# Patient Record
Sex: Female | Born: 1990 | Race: White | Hispanic: No | Marital: Single | Smoking: Current some day smoker
Health system: Southern US, Community
[De-identification: ages and names within clinical notes are randomized; demographics above are authoritative.]

## PROBLEM LIST (undated history)

## (undated) DIAGNOSIS — A6004 Herpesviral vulvovaginitis: Secondary | ICD-10-CM

## (undated) DIAGNOSIS — F411 Generalized anxiety disorder: Secondary | ICD-10-CM

## (undated) DIAGNOSIS — F988 Other specified behavioral and emotional disorders with onset usually occurring in childhood and adolescence: Secondary | ICD-10-CM

## (undated) DIAGNOSIS — G44229 Chronic tension-type headache, not intractable: Secondary | ICD-10-CM

## (undated) DIAGNOSIS — B002 Herpesviral gingivostomatitis and pharyngotonsillitis: Secondary | ICD-10-CM

## (undated) HISTORY — DX: Other specified behavioral and emotional disorders with onset usually occurring in childhood and adolescence: F98.8

## (undated) HISTORY — DX: Chronic tension-type headache, not intractable: G44.229

## (undated) HISTORY — PX: TONSILLECTOMY: SHX5217

## (undated) HISTORY — DX: Herpesviral vulvovaginitis: A60.04

## (undated) HISTORY — DX: Herpesviral gingivostomatitis and pharyngotonsillitis: B00.2

## (undated) HISTORY — DX: Generalized anxiety disorder: F41.1

---
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
## 2012-04-20 ENCOUNTER — Observation Stay: Payer: Self-pay | Admitting: Internal Medicine

## 2012-04-20 LAB — PRENATAL PANEL
ABO/RH(D): O POS
Antibody Screen: NEGATIVE
Glucose: 75 mg/dL (ref 65–99)
HCT: 31.4 % — ABNORMAL LOW (ref 35.0–47.0)
HGB: 10.8 g/dL — ABNORMAL LOW (ref 12.0–16.0)
MCH: 31.1 pg (ref 26.0–34.0)
MCHC: 34.4 g/dL (ref 32.0–36.0)
Platelet: 244 10*3/uL (ref 150–440)
WBC: 13.1 10*3/uL — ABNORMAL HIGH (ref 3.6–11.0)

## 2012-09-05 ENCOUNTER — Inpatient Hospital Stay: Payer: Self-pay | Admitting: Obstetrics & Gynecology

## 2012-09-05 LAB — CBC WITH DIFFERENTIAL/PLATELET
Basophil #: 0.1 10*3/uL (ref 0.0–0.1)
Eosinophil %: 0.4 %
Lymphocyte #: 1.6 10*3/uL (ref 1.0–3.6)
Lymphocyte %: 17.5 %
MCH: 31.7 pg (ref 26.0–34.0)
Monocyte %: 9.9 %
Neutrophil #: 6.6 10*3/uL — ABNORMAL HIGH (ref 1.4–6.5)
Neutrophil %: 71.1 %
RBC: 3.8 10*6/uL (ref 3.80–5.20)
RDW: 15.1 % — ABNORMAL HIGH (ref 11.5–14.5)

## 2012-09-06 LAB — GC/CHLAMYDIA PROBE AMP

## 2012-09-08 LAB — PATHOLOGY REPORT

## 2012-09-08 LAB — HEMATOCRIT: HCT: 27.1 % — ABNORMAL LOW (ref 35.0–47.0)

## 2013-07-01 ENCOUNTER — Emergency Department: Payer: Self-pay | Admitting: Emergency Medicine

## 2013-07-02 LAB — URINALYSIS, COMPLETE
BILIRUBIN, UR: NEGATIVE
Bacteria: NONE SEEN
Blood: NEGATIVE
GLUCOSE, UR: NEGATIVE mg/dL (ref 0–75)
Ketone: NEGATIVE
Leukocyte Esterase: NEGATIVE
NITRITE: NEGATIVE
PH: 5 (ref 4.5–8.0)
PROTEIN: NEGATIVE
Specific Gravity: 1.031 (ref 1.003–1.030)
Squamous Epithelial: 1
WBC UR: 4 /HPF (ref 0–5)

## 2013-07-02 LAB — COMPREHENSIVE METABOLIC PANEL
ALBUMIN: 3.9 g/dL (ref 3.4–5.0)
ALK PHOS: 79 U/L
ALT: 15 U/L (ref 12–78)
ANION GAP: 6 — AB (ref 7–16)
BUN: 14 mg/dL (ref 7–18)
Bilirubin,Total: 0.4 mg/dL (ref 0.2–1.0)
Calcium, Total: 8.8 mg/dL (ref 8.5–10.1)
Chloride: 105 mmol/L (ref 98–107)
Co2: 26 mmol/L (ref 21–32)
Creatinine: 0.88 mg/dL (ref 0.60–1.30)
EGFR (Non-African Amer.): >60
GLUCOSE: 83 mg/dL (ref 65–99)
Osmolality: 273 (ref 275–301)
POTASSIUM: 3.9 mmol/L (ref 3.5–5.1)
SGOT(AST): 14 U/L — ABNORMAL LOW (ref 15–37)
Sodium: 137 mmol/L (ref 136–145)
Total Protein: 7 g/dL (ref 6.4–8.2)

## 2013-07-02 LAB — CBC WITH DIFFERENTIAL/PLATELET
Basophil #: 0 10*3/uL (ref 0.0–0.1)
Basophil %: 0.4 %
Eosinophil #: 0 10*3/uL (ref 0.0–0.7)
Eosinophil %: 0.7 %
HCT: 38.6 % (ref 35.0–47.0)
HGB: 12.8 g/dL (ref 12.0–16.0)
LYMPHS ABS: 1.2 10*3/uL (ref 1.0–3.6)
LYMPHS PCT: 19.2 %
MCH: 28.8 pg (ref 26.0–34.0)
MCHC: 33.1 g/dL (ref 32.0–36.0)
MCV: 87 fL (ref 80–100)
MONO ABS: 0.6 x10 3/mm (ref 0.2–0.9)
MONOS PCT: 9.4 %
Neutrophil #: 4.2 10*3/uL (ref 1.4–6.5)
Neutrophil %: 70.3 %
Platelet: 247 10*3/uL (ref 150–440)
RBC: 4.45 10*6/uL (ref 3.80–5.20)
RDW: 13.3 % (ref 11.5–14.5)
WBC: 6 10*3/uL (ref 3.6–11.0)

## 2013-07-02 LAB — PREGNANCY, URINE: Pregnancy Test, Urine: NEGATIVE m[IU]/mL

## 2014-09-29 ENCOUNTER — Inpatient Hospital Stay
Admission: AD | Admit: 2014-09-29 | Discharge: 2014-10-02 | DRG: 885 | Disposition: A | Discharge status: Home / Self Care | Payer: 59 | Source: Ambulatory Visit | Attending: Psychiatry | Admitting: Psychiatry

## 2014-09-29 ENCOUNTER — Emergency Department
Admission: EM | Admit: 2014-09-29 | Discharge: 2014-09-29 | Disposition: A | Discharge status: Other Acute Inpatient Hospital | Payer: 59 | Attending: Emergency Medicine | Admitting: Emergency Medicine

## 2014-09-29 ENCOUNTER — Encounter: Payer: Self-pay | Admitting: *Deleted

## 2014-09-29 ENCOUNTER — Encounter: Payer: Self-pay | Admitting: Emergency Medicine

## 2014-09-29 DIAGNOSIS — F1310 Sedative, hypnotic or anxiolytic abuse, uncomplicated: Secondary | ICD-10-CM | POA: Diagnosis present

## 2014-09-29 DIAGNOSIS — R45851 Suicidal ideations: Secondary | ICD-10-CM | POA: Diagnosis present

## 2014-09-29 DIAGNOSIS — T1491 Suicide attempt: Secondary | ICD-10-CM | POA: Insufficient documentation

## 2014-09-29 DIAGNOSIS — F1220 Cannabis dependence, uncomplicated: Secondary | ICD-10-CM | POA: Diagnosis present

## 2014-09-29 DIAGNOSIS — F909 Attention-deficit hyperactivity disorder, unspecified type: Secondary | ICD-10-CM | POA: Diagnosis present

## 2014-09-29 DIAGNOSIS — F1193 Opioid use, unspecified with withdrawal: Secondary | ICD-10-CM

## 2014-09-29 DIAGNOSIS — Z811 Family history of alcohol abuse and dependence: Secondary | ICD-10-CM

## 2014-09-29 DIAGNOSIS — F17210 Nicotine dependence, cigarettes, uncomplicated: Secondary | ICD-10-CM | POA: Diagnosis present

## 2014-09-29 DIAGNOSIS — F322 Major depressive disorder, single episode, severe without psychotic features: Secondary | ICD-10-CM | POA: Diagnosis present

## 2014-09-29 DIAGNOSIS — F1123 Opioid dependence with withdrawal: Secondary | ICD-10-CM | POA: Diagnosis present

## 2014-09-29 DIAGNOSIS — F17200 Nicotine dependence, unspecified, uncomplicated: Secondary | ICD-10-CM

## 2014-09-29 DIAGNOSIS — F419 Anxiety disorder, unspecified: Secondary | ICD-10-CM | POA: Diagnosis present

## 2014-09-29 DIAGNOSIS — F1210 Cannabis abuse, uncomplicated: Secondary | ICD-10-CM | POA: Insufficient documentation

## 2014-09-29 DIAGNOSIS — Z720 Tobacco use: Secondary | ICD-10-CM | POA: Insufficient documentation

## 2014-09-29 DIAGNOSIS — F1120 Opioid dependence, uncomplicated: Secondary | ICD-10-CM

## 2014-09-29 DIAGNOSIS — G4700 Insomnia, unspecified: Secondary | ICD-10-CM | POA: Diagnosis present

## 2014-09-29 DIAGNOSIS — F1320 Sedative, hypnotic or anxiolytic dependence, uncomplicated: Secondary | ICD-10-CM

## 2014-09-29 LAB — COMPREHENSIVE METABOLIC PANEL
ALK PHOS: 47 U/L (ref 38–126)
ALT: 14 U/L (ref 14–54)
AST: 31 U/L (ref 15–41)
Albumin: 5.2 g/dL — ABNORMAL HIGH (ref 3.5–5.0)
Anion gap: 9 (ref 5–15)
BUN: 16 mg/dL (ref 6–20)
CALCIUM: 9.6 mg/dL (ref 8.9–10.3)
CO2: 25 mmol/L (ref 22–32)
CREATININE: 0.96 mg/dL (ref 0.44–1.00)
Chloride: 105 mmol/L (ref 101–111)
Glucose, Bld: 92 mg/dL (ref 65–99)
Potassium: 4.3 mmol/L (ref 3.5–5.1)
Sodium: 139 mmol/L (ref 135–145)
Total Bilirubin: 1.6 mg/dL — ABNORMAL HIGH (ref 0.3–1.2)
Total Protein: 8.4 g/dL — ABNORMAL HIGH (ref 6.5–8.1)

## 2014-09-29 LAB — CBC
HEMATOCRIT: 38.2 % (ref 35.0–47.0)
Hemoglobin: 13 g/dL (ref 12.0–16.0)
MCH: 29.6 pg (ref 26.0–34.0)
MCHC: 34.1 g/dL (ref 32.0–36.0)
MCV: 86.7 fL (ref 80.0–100.0)
PLATELETS: 232 10*3/uL (ref 150–440)
RBC: 4.4 MIL/uL (ref 3.80–5.20)
RDW: 13.1 % (ref 11.5–14.5)
WBC: 11.3 10*3/uL — AB (ref 3.6–11.0)

## 2014-09-29 LAB — ETHANOL

## 2014-09-29 LAB — URINE DRUG SCREEN, QUALITATIVE (ARMC ONLY)
AMPHETAMINES, UR SCREEN: NOT DETECTED
BARBITURATES, UR SCREEN: POSITIVE — AB
BENZODIAZEPINE, UR SCRN: POSITIVE — AB
CANNABINOID 50 NG, UR ~~LOC~~: POSITIVE — AB
Cocaine Metabolite,Ur ~~LOC~~: NOT DETECTED
MDMA (Ecstasy)Ur Screen: NOT DETECTED
Methadone Scn, Ur: NOT DETECTED
OPIATE, UR SCREEN: NOT DETECTED
PHENCYCLIDINE (PCP) UR S: NOT DETECTED
Tricyclic, Ur Screen: NOT DETECTED

## 2014-09-29 LAB — PREGNANCY, URINE: PREG TEST UR: NEGATIVE

## 2014-09-29 MED ORDER — ACETAMINOPHEN 325 MG PO TABS: 650.0000 mg | ORAL_TABLET | Freq: Four times a day (QID) | ORAL | Status: DC | PRN

## 2014-09-29 MED ORDER — FLUOXETINE HCL 20 MG PO CAPS
20.0000 mg | ORAL_CAPSULE | Freq: Every day | ORAL | Status: DC
  Administered 2014-09-30: 20 mg via ORAL
  Filled 2014-09-29: qty 1

## 2014-09-29 MED ORDER — INFLUENZA VAC SPLIT QUAD 0.5 ML IM SUSY: 0.5000 mL | PREFILLED_SYRINGE | INTRAMUSCULAR | Status: DC

## 2014-09-29 MED ORDER — ALUM & MAG HYDROXIDE-SIMETH 200-200-20 MG/5ML PO SUSP: 30.0000 mL | ORAL | Status: DC | PRN

## 2014-09-29 MED ORDER — TRAZODONE HCL 50 MG PO TABS
50.0000 mg | ORAL_TABLET | Freq: Every evening | ORAL | Status: DC | PRN
  Administered 2014-09-29: 50 mg via ORAL
  Filled 2014-09-29: qty 1

## 2014-09-29 MED ORDER — LORAZEPAM 2 MG PO TABS
2.0000 mg | ORAL_TABLET | Freq: Once | ORAL | Status: AC
  Administered 2014-09-29: 2 mg via ORAL
  Filled 2014-09-29: qty 1

## 2014-09-29 MED ORDER — MAGNESIUM HYDROXIDE 400 MG/5ML PO SUSP: 30.0000 mL | Freq: Every day | ORAL | Status: DC | PRN

## 2014-09-29 NOTE — ED Notes (Signed)
MD at bedside. Dr.Quigley, IVC paper being started , no 1:1 sitter needed, q 15 min safety check

## 2014-09-29 NOTE — ED Notes (Signed)
BEHAVIORAL HEALTH ROUNDING Patient sleeping: No. Patient alert and oriented: yes Behavior appropriate: Yes.  ; If no, describe:  Nutrition and fluids offered: Yes  Toileting and hygiene offered: Yes  Sitter present: yes Law enforcement present: Yes  

## 2014-09-29 NOTE — Consult Note (Addendum)
[facility] Consult   Reason for Consult:  "I want to end it all."  Referring Physician:  Daymon Larsen, MD  Patient Identification: Laura Mcintosh MRN:  [id number]   Principal Diagnosis: Suicide attempt Diagnosis:   Patient Active Problem List   Diagnosis Date Noted  . Adjustment disorder with mixed anxiety and depressed mood [F43.23] 09/29/2014  . Suicide attempt [T14.91] 09/29/2014    Total Time spent with patient: 30 minutes  Subjective:   Laura Mcintosh is a 24 y.o. female patient admitted with tearfulness and stating she "wants to end it all."  Nursing notes were reviewed by this provider. It appears the pt broke up with her boyfriend 2 days ago. Yesterday, she attempted to overdose and cut her wrist with a dull razor.  Today the pt reports she want to jump off of a bridge. IVC was started.   Per ED Physician's notes, the pt presents with SI. She shared she had poor interactions with the father of her child recently.  She endorsed overdosing on medications yesterday with intent to harm herself and she tried to cut her wrists. She endorsed taking "uppers and downers" to the ED physician which included clonidine, cocaine and Suboxone.  While on her way to the hospital, the pt reported she considered driving off a bridge to harm herself. She denied HI and AVH.  She denies a previous h/o SI.   Today on interview Laura Mcintosh states, "I lost the 3 most important people to me." She is tearful, making poor eye contact and shared she lost her boyfriend of 5 years (he is the father of her 2yo son), her mother "who has always been there for me" and her 2yo son. About 2 days ago, she broke with her boyfriend. She discussed her boyfriend and her mother now want nothing to do with her but this provider was unable to understand why they do not want anything to do with the patient.  Due to these losses, the pt attempted suicide yesterday as noted above. The pt is not remorseful  of the event and wishes she was successful in completing suicide. She shared she wanted to cut her wrists but she as too afraid to do so.  She is not happy to be alive at the time of this interview. "I feel like a horrible mother."   For the past several days, the pt endorses the following: decreased sleep; insomnia; guilt; anhedonia; decreased energy; decreased concentration; decreased appetite and suicidal ideation.   When driving to the ER, she thought about driving off of the [place] bridge but did not do so because she thought about her son. She notes he is the only reason I am alive from day to day.   She discussed the relationship with her ex-boyfriend is unstable with frequent arguing.  She shared the relationship is not a good or healthy relationship for her; the pt did not discuss this in detail.   Pt currently endorses SI without a specific plan. She denies HI and AVH.   HPI:  See above HPI Elements:   Quality:  worsening. Severity:  severe. Timing:  the past 2 days. Duration:  several days. Context:  due to break up with boyfriend.   Past Psychiatric History: Past diagnoses: Pt is uncertain but she believes ADHD, anxiety and insomnia Psychiatrist: Does not have a current psychiatrist.   - last saw a psychiatrist, possibly Dr. Wynetta Emery in [place], [place] about 2-3 years ago.  Therapist: Does not currently  have a therapist  - Pt did have a therapist in [place], [place] Hospitalizations: Denies ECT: Denies Suicide attempt/Self-harm: Denies past attempts to this provider other than as mentioned in the HPI.  Homicide attempts/harming others: Denies Past psychiatric medications: Adderall, clonazepam, Xanax and Trazodone.  - Pt felt medications were helpful.  - Pt has not tried Vistaril.   Past Medical History: History reviewed. No pertinent past medical history. History reviewed. No pertinent past surgical history.   Family History: History reviewed. No pertinent family history.    Social History:  History  Alcohol Use No     History  Drug Use Not on file    Social History   Social History  . Marital Status: Single    Spouse Name: N/A  . Number of Children: N/A  . Years of Education: N/A   Social History Main Topics  . Smoking status: Current Every Day Smoker  . Smokeless tobacco: None  . Alcohol Use: No  . Drug Use: None  . Sexual Activity: Not Asked   Other Topics Concern  . None   Social History Narrative  . None   Additional Social History: Was living with boyfriend.  Has a 2yo son.   Allergies:  No Known Allergies  Labs:  Results for orders placed or performed during the hospital encounter of 09/29/14 (from the past 48 hour(s))  Comprehensive metabolic panel     Status: Abnormal   Collection Time: 09/29/14  9:24 AM  Result Value Ref Range   Sodium 139 135 - 145 mmol/L   Potassium 4.3 3.5 - 5.1 mmol/L    Comment: HEMOLYSIS AT THIS LEVEL MAY AFFECT RESULT   Chloride 105 101 - 111 mmol/L   CO2 25 22 - 32 mmol/L   Glucose, Bld 92 65 - 99 mg/dL   BUN 16 6 - 20 mg/dL   Creatinine, Ser 0.96 0.44 - 1.00 mg/dL   Calcium 9.6 8.9 - 10.3 mg/dL   Total Protein 8.4 (H) 6.5 - 8.1 g/dL   Albumin 5.2 (H) 3.5 - 5.0 g/dL   AST 31 15 - 41 U/L    Comment: HEMOLYSIS AT THIS LEVEL MAY AFFECT RESULT   ALT 14 14 - 54 U/L   Alkaline Phosphatase 47 38 - 126 U/L   Total Bilirubin 1.6 (H) 0.3 - 1.2 mg/dL    Comment: HEMOLYSIS AT THIS LEVEL MAY AFFECT RESULT   GFR calc non Af Amer >60 >60 mL/min   GFR calc Af Amer >60 >60 mL/min    Comment: (NOTE) The eGFR has been calculated using the CKD EPI equation. This calculation has not been validated in all clinical situations. eGFR's persistently <60 mL/min signify possible Chronic Kidney Disease.    Anion gap 9 5 - 15  Ethanol (ETOH)     Status: None   Collection Time: 09/29/14  9:24 AM  Result Value Ref Range   Alcohol, Ethyl (B) <5 <5 mg/dL    Comment:        LOWEST DETECTABLE LIMIT FOR SERUM  ALCOHOL IS 5 mg/dL FOR MEDICAL PURPOSES ONLY   CBC     Status: Abnormal   Collection Time: 09/29/14  9:24 AM  Result Value Ref Range   WBC 11.3 (H) 3.6 - 11.0 K/uL   RBC 4.40 3.80 - 5.20 MIL/uL   Hemoglobin 13.0 12.0 - 16.0 g/dL   HCT 38.2 35.0 - 47.0 %   MCV 86.7 80.0 - 100.0 fL   MCH 29.6 26.0 - 34.0 pg  MCHC 34.1 32.0 - 36.0 g/dL   RDW 13.1 11.5 - 14.5 %   Platelets 232 150 - 440 K/uL    Vitals: Blood pressure 121/75, pulse 90, temperature 98.9 F (37.2 C), temperature source Oral, resp. rate 20, height 5' 1"  (1.549 m), weight 47.628 kg (105 lb), last menstrual period 09/15/2014, SpO2 100 %.  Risk to Self: Is patient at risk for suicide?: No Risk to Others:   Prior Inpatient Therapy:   Prior Outpatient Therapy:    Current Facility-Administered Medications  Medication Dose Route Frequency Provider Last Rate Last Dose  . LORazepam (ATIVAN) tablet 2 mg  2 mg Oral Once Daymon Larsen, MD       No current outpatient prescriptions on file.    Musculoskeletal: Strength & Muscle Tone: within normal limits Gait & Station: not assessed Patient leans: N/A  Psychiatric Specialty Exam: Physical Exam  Review of Systems  Constitutional: Negative.   HENT: Negative.   Eyes: Negative.   Respiratory: Negative.   Cardiovascular: Negative.   Gastrointestinal: Negative.   Genitourinary: Negative.   Musculoskeletal: Negative.   Skin: Negative.   Neurological: Negative.   Endo/Heme/Allergies: Negative.   Psychiatric/Behavioral: Positive for depression, suicidal ideas and substance abuse. Negative for hallucinations and memory loss. The patient is nervous/anxious and has insomnia.     Blood pressure 121/75, pulse 90, temperature 98.9 F (37.2 C), temperature source Oral, resp. rate 20, height 5' 1"  (1.549 m), weight 47.628 kg (105 lb), last menstrual period 09/15/2014, SpO2 100 %.Body mass index is 19.85 kg/(m^2).  General Appearance: Casual, Fairly Groomed and wearing disposible  hospital scrubs  Eye Contact::  Poor  Speech:  Garbled and Normal Rate  Volume:  Normal  Mood:  Depressed  Affect:  Depressed and Tearful  Thought Process:  Coherent, Goal Directed, Intact, Linear and Logical  Orientation:  Full (Time, Place, and Person)  Thought Content:  Negative  Suicidal Thoughts:  Yes.  without intent/plan  Homicidal Thoughts:  No  Memory:  Immediate;   Good Recent;   Good Remote;   Good  Judgement:  Poor  Insight:  Fair  Psychomotor Activity:  Normal  Concentration:  Fair  Recall:  Good  Fund of Knowledge:Good  Language: Good  Akathisia:  Negative  Handed:  Right  AIMS (if indicated):     Assets:  Communication Skills Desire for Improvement Physical Health Resilience Social Support Transportation  ADL's:  Intact  Cognition: WNL  Sleep:   decreased   Medical Decision Making: Review of Psycho-Social Stressors (1), Review or order clinical lab tests (1), Review or order medicine tests (1), Review of Medication Regimen & Side Effects (2) and Review of New Medication or Change in Dosage (2)  Treatment Plan Summary: Plan admit to inpatient psychiatry  1. Start Vistaril 37m po TID/prn for anxiety.  2. Consider starting an SSRI such as sertraline for anxiety and possible depression.  3. Risks, benefits and side effects discussed including but not limited to any and all black box warnings.   Plan:  Recommend psychiatric Inpatient admission when medically cleared. Disposition: Inpatient admission  MDonita Brooks9/03/2014 10:52 AM

## 2014-09-29 NOTE — ED Notes (Signed)
BEHAVIORAL HEALTH ROUNDING Patient sleeping: No. Patient alert and oriented: yes Behavior appropriate: No.; If no, describe: tearful  Nutrition and fluids offered: No Toileting and hygiene offered: Yes  Sitter present: no Law enforcement present: Yes

## 2014-09-29 NOTE — ED Notes (Addendum)
Patient's belongings in two bags with labels. Stored in [facility] locker 21

## 2014-09-29 NOTE — ED Notes (Signed)
Patient assigned to appropriate care area. Patient oriented to unit/care area: Informed that, for their safety, care areas are designed for safety and monitored by security cameras at all times; and visiting hours explained to patient. Patient verbalizes understanding, and verbal contract for safety obtained.   ENVIRONMENTAL ASSESSMENT Potentially harmful objects out of patient reach: Yes.   Personal belongings secured: Yes.   Patient dressed in hospital provided attire only: Yes.   Plastic bags out of patient reach: Yes.   Patient care equipment (cords, cables, call bells, lines, and drains) shortened, removed, or accounted for: Yes.   Equipment and supplies removed from bottom of stretcher: Yes.   Potentially toxic materials out of patient reach: Yes.   Sharps container removed or out of patient reach: Yes.   

## 2014-09-29 NOTE — ED Notes (Signed)
Pt tearful , pt recently broke up with her boyfriend x2 days afgo , attempted to overdose yesterday and cut her wrist with a dull razor, to signs or marks to either wrist. Today pt want to jump off a bridge, no family is currently aware that pt is here, has  Child that she jumped off at her mothers prior to arriving here.

## 2014-09-29 NOTE — ED Notes (Signed)

## 2014-09-29 NOTE — BH Assessment (Addendum)
Assessment Note  Laura Mcintosh is an 24 y.o. female. Pt was extremely tearful during TTS assessment; this writer was unable to gather all assessment information. She reports her reason for voluntarily presenting to the ED as "Pretty much I'm giving up on everything ... My mom doesn't want anything to do with me. She's fed up with me and son's father. He left me for some things he found in my phone. We been together for 5 years". Pt reports her 2 yo son is currently with her mother Laura Mcintosh: [phone]). This Clinical research associate called pt's mother to verify whereabouts of pt's son; pt's mother confirmed the child is currently in her care. When asked her plan of SI, she states she had thoughts to drive her car off a bridge ... "I just want to be done with it, I'll be doing my mom a favor". Pt denied past SI, with this being her first time having these thoughts. Laura Mcintosh states "he kicked me out, I don't have anywhere to go". She reports she is currently homeless due to the breakup with her boyfriend. Pt denied HI/AH/VH.  This Clinical research associate ended the interview since pt had excessive tearfulness and didn't want to answer anymore assessment questions.  Axis I: Depressive Disorder NOS Axis II: Deferred Axis III: History reviewed. No pertinent past medical history. Axis IV: housing problems and problems with primary support group Axis V: 11-20 some danger of hurting self or others possible OR occasionally fails to maintain minimal personal hygiene OR gross impairment in communication  Past Medical History: History reviewed. No pertinent past medical history.  History reviewed. No pertinent past surgical history.  Family History: History reviewed. No pertinent family history.  Social History:  reports that she has been smoking.  She does not have any smokeless tobacco history on file. She reports that she does not drink alcohol. Her drug history is not on file.  Additional Social History:  Alcohol / Drug Use History of  alcohol / drug use?: No history of alcohol / drug abuse  CIWA: CIWA-Ar BP: 121/75 mmHg Pulse Rate: 90 COWS:    Allergies: No Known Allergies  Home Medications:  (Not in a hospital admission)  OB/GYN Status:  Patient's last menstrual period was 09/15/2014.  General Assessment Data Location of Assessment: [facility] ED TTS Assessment: In system Is this a Tele or Face-to-Face Assessment?: Face-to-Face Is this an Initial Assessment or a Re-assessment for this encounter?: Initial Assessment Marital status: Single Maiden name: N/A Is patient pregnant?: No Pregnancy Status: No Living Arrangements: Spouse/significant other, Children (Pt states she was "kicked out") Can pt return to current living arrangement?: No Admission Status: Voluntary Is patient capable of signing voluntary admission?: Yes Referral Source: Self/Family/Friend Insurance type: None  Medical Screening Exam [facility] Walk-in ONLY) Medical Exam completed: Yes  Crisis Care Plan Living Arrangements: Spouse/significant other, Children (Pt states she was "kicked out") Name of Psychiatrist: Unable to Assess Name of Therapist: Unable to Assess  Education Status Is patient currently in school?:  (Unable to Assess) Current Grade: N/A Highest grade of school patient has completed: N/A Name of school: N/A Contact person: N/A  Risk to self with the past 6 months Suicidal Ideation: Yes-Currently Present Has patient been a risk to self within the past 6 months prior to admission? : Yes Suicidal Intent: Yes-Currently Present Has patient had any suicidal intent within the past 6 months prior to admission? : Yes Is patient at risk for suicide?: Yes Suicidal Plan?: Yes-Currently Present Has patient had any suicidal plan  within the past 6 months prior to admission? : Yes Specify Current Suicidal Plan: To drive her car off a bridge Access to Means: Yes Specify Access to Suicidal Means: She owns a car What has been your use of  drugs/alcohol within the last 12 months?: N/A Previous Attempts/Gestures: No Other Self Harm Risks: None Reported Triggers for Past Attempts: None known Intentional Self Injurious Behavior: None Family Suicide History: Unknown Recent stressful life event(s): Conflict (Comment) (With boyfriend; recent breakup) Persecutory voices/beliefs?: No Depression: Yes Depression Symptoms: Tearfulness, Guilt, Feeling worthless/self pity Substance abuse history and/or treatment for substance abuse?: No Suicide prevention information given to non-admitted patients: Yes  Risk to Others within the past 6 months Homicidal Ideation: No Does patient have any lifetime risk of violence toward others beyond the six months prior to admission? : No Thoughts of Harm to Others: No Current Homicidal Intent: No Current Homicidal Plan: No Access to Homicidal Means: No History of harm to others?: No Assessment of Violence: None Noted Violent Behavior Description: None noted Does patient have access to weapons?: No Criminal Charges Pending?: No Does patient have a court date: No Is patient on probation?: No  Psychosis Hallucinations: None noted Delusions: None noted  Mental Status Report Appearance/Hygiene: In scrubs, In hospital gown Eye Contact: Fair Motor Activity: Unremarkable Speech: Logical/coherent Level of Consciousness: Crying Mood: Sad, Depressed, Guilty Affect: Depressed, Flat Anxiety Level: Moderate Thought Processes: Coherent, Relevant Judgement: Impaired Orientation: Person, Place, Time, Situation, Appropriate for developmental age Obsessive Compulsive Thoughts/Behaviors: None  Cognitive Functioning Concentration: Normal Memory: Recent Intact, Remote Intact IQ: Average Insight: Poor Impulse Control: Poor Appetite: Fair Weight Loss: 0 Weight Gain: 0 Sleep: No Change Total Hours of Sleep: 0 Vegetative Symptoms: None  ADLScreening [facility] Assessment Services) Patient's cognitive  ability adequate to safely complete daily activities?: Yes Patient able to express need for assistance with ADLs?: Yes Independently performs ADLs?: Yes (appropriate for developmental age)  Prior Inpatient Therapy Prior Inpatient Therapy: No (Unknown; Unable to Assess) Prior Therapy Dates: N/A Prior Therapy Facilty/Provider(s): N/A Reason for Treatment: N/A  Prior Outpatient Therapy Prior Outpatient Therapy: No (Unknown; Unable to Assess) Prior Therapy Dates: N/A Prior Therapy Facilty/Provider(s): N/A Reason for Treatment: N/A Does patient have an ACCT team?: No Does patient have Intensive In-House Services?  : No Does patient have [facility] services? : No Does patient have P4CC services?: No  ADL Screening (condition at time of admission) Patient's cognitive ability adequate to safely complete daily activities?: Yes Patient able to express need for assistance with ADLs?: Yes Independently performs ADLs?: Yes (appropriate for developmental age)       Abuse/Neglect Assessment (Assessment to be complete while patient is alone) Physical Abuse: Denies Verbal Abuse: Denies Sexual Abuse: Denies Exploitation of patient/patient's resources: Denies Self-Neglect: Denies Values / Beliefs Cultural Requests During Hospitalization: None Spiritual Requests During Hospitalization: None Consults Spiritual Care Consult Needed: No Social Work Consult Needed: No Merchant navy officer (For Healthcare) Does patient have an advance directive?: No Would patient like information on creating an advanced directive?: Yes English as a second language teacher given    Additional Information 1:1 In Past 12 Months?: No CIRT Risk: No Elopement Risk: No Does patient have medical clearance?: Yes  Child/Adolescent Assessment Running Away Risk: Denies Bed-Wetting: Denies Destruction of Property: Denies Cruelty to Animals: Denies Stealing: Denies Rebellious/Defies Authority: Denies Satanic Involvement:  Denies Archivist: Denies Problems at [employer]: Denies Gang Involvement: Denies  Disposition:  Disposition Initial Assessment Completed for this Encounter: Yes Disposition of Patient: Referred to Patient referred  to:  Merilyn Baba MD to see)  On Site Evaluation by:   Reviewed with Physician:    Wilmon Arms 09/29/2014 2:35 PM

## 2014-09-29 NOTE — ED Provider Notes (Signed)
Time Seen: Approximately ----------------------------------------- 10:14 AM on 09/29/2014 -----------------------------------------   I have reviewed the triage notes  Chief Complaint: Suicidal   History of Present Illness: Laura Mcintosh is a 24 y.o. female who presents with suicidal ideation. Patient states that she's had a poor interactions recently with her child's father. She states that she overdosed on meds yesterday with tense to harm herself and also tried to cut her wrists. The patient states that she took "" uppers and downers"" yesterday. These includes clonidine, cocaine, Suboxone. The patient states on her way here to the hospital she had thoughts of driving off the bridge to harm herself. She denies any homicidal thoughts or hallucinations. She denies any previous history of suicidal ideation. She denies any physical complaints at this point.   History reviewed. No pertinent past medical history.  There are no active problems to display for this patient.   History reviewed. No pertinent past surgical history.  History reviewed. No pertinent past surgical history.  No current outpatient prescriptions on file.  Allergies:  Review of patient's allergies indicates no known allergies.  Family History: History reviewed. No pertinent family history.  Social History: Social History  Substance Use Topics  . Smoking status: Current Every Day Smoker  . Smokeless tobacco: None  . Alcohol Use: No     Review of Systems:   10 point review of systems was performed and was otherwise negative:  Constitutional: No fever Eyes: No visual disturbances ENT: No sore throat, ear pain Cardiac: No chest pain Respiratory: No shortness of breath, wheezing, or stridor Abdomen: No abdominal pain, no vomiting, No diarrhea Endocrine: No weight loss, No night sweats Extremities: No peripheral edema, cyanosis Skin: No rashes, easy bruising Neurologic: No focal weakness, trouble  with speech or swollowing Urologic: No dysuria, Hematuria, or urinary frequency   Physical Exam:  ED Triage Vitals  Enc Vitals Group     BP 09/29/14 0917 121/75 mmHg     Pulse Rate 09/29/14 0917 90     Resp 09/29/14 0917 20     Temp 09/29/14 0917 98.9 F (37.2 C)     Temp Source 09/29/14 0917 Oral     SpO2 09/29/14 0917 100 %     Weight 09/29/14 0917 105 lb (47.628 kg)     Height 09/29/14 0917  (1.549 m)     Head Cir --      Peak Flow --      Pain Score 09/29/14 0916 0     Pain Loc --      Pain Edu? --      Excl. in GC? --     General: Awake , Alert , and Oriented times 3; GCS 15 Head: Normal cephalic , atraumatic Eyes: Pupils equal , round, reactive to light Nose/Throat: No nasal drainage, patent upper airway without erythema or exudate.  Neck: Supple, Full range of motion, No anterior adenopathy or palpable thyroid masses Lungs: Clear to ascultation without wheezes , rhonchi, or rales Heart: Regular rate, regular rhythm without murmurs , gallops , or rubs Abdomen: Soft, non tender without rebound, guarding , or rigidity; bowel sounds positive and symmetric in all 4 quadrants. No organomegaly .        Extremities: 2 plus symmetric pulses. No edema, clubbing or cyanosis Neurologic: normal ambulation, Motor symmetric without deficits, sensory intact Skin: warm, dry, no rashes   Labs:   All laboratory work was reviewed including any pertinent negatives or positives listed below:  Labs Reviewed  COMPREHENSIVE METABOLIC PANEL - Abnormal; Notable for the following:    Total Protein 8.4 (*)    Albumin 5.2 (*)    Total Bilirubin 1.6 (*)    All other components within normal limits  CBC - Abnormal; Notable for the following:    WBC 11.3 (*)    All other components within normal limits  ETHANOL  URINE DRUG SCREEN, QUALITATIVE ([facility] ONLY)  SALICYLATE LEVEL  TSH  ACETAMINOPHEN LEVEL   patient is going to have routine lab work performed. She will be screened for  aspirin and Tylenol toxicity along with hyper thyroidism.    Procedures:  Patient's had psychiatry consult established. She is medically cleared and IVC paperwork has been filled out. She has a Comptroller outside the door.       ED Course:  Patient arrives very anxious and IVC is paperwork has been filled out. When she supplies's with urine sample she'll be given Ativan 2 mg by mouth 1. Psychiatry consult is pending.   Assessment: Suicidal ideation   Final Clinical Impression:  Likely inpatient psychiatric treatment Final diagnoses:  None     Plan: Psychiatry evaluation          Jennye Moccasin, MD 09/29/14 1017

## 2014-09-29 NOTE — ED Notes (Signed)
BEHAVIORAL HEALTH ROUNDING Patient sleeping: No. Patient alert and oriented: yes Behavior appropriate: Yes.  ; If no, describe:  Nutrition and fluids offered: No Toileting and hygiene offered: Yes  Sitter present: no Law enforcement present: Yes  

## 2014-09-29 NOTE — H&P (Signed)
Psychiatric Admission Assessment Adult  Patient Identification: Laura Mcintosh MRN:  [id number] Date of Evaluation:  09/30/2014 Chief Complaint:  Depression and substance abuse  Principal Diagnosis: Severe major depression, single episode, without psychotic features Diagnosis:   Patient Active Problem List   Diagnosis Date Noted  . Severe major depression, single episode, without psychotic features [F32.2] 09/30/2014  . Tobacco use disorder [Z72.0] 09/30/2014  . Cannabis use disorder, severe, dependence [F12.20] 09/30/2014  . Moderate benzodiazepine use disorder [F13.90] 09/30/2014  . Opioid use disorder, severe, dependence [F11.20] 09/30/2014  . Opioid use with withdrawal [F11.93] 09/30/2014   History of Present Illness: Laura Mcintosh is a 24 y.o. female patient admitted with tearfulness and stating she "wants to end it all."  Pt broke up with her boyfriend 3 days ago. On 9/2, she attempted to overdose and cut her wrist with a dull razor. Yesterday the pt reported she wanted to jump off of a bridge.   Patient admitted to behavioral med unit from ED due to [place]. She states she is here because she "was hopeless and ready to end it all, and I needed to get myself clean." Patient reports having an argument with her boyfriend yesterday and having to leave their home because of this. She sent her 34-year-old son to stay with her mother. She said her boyfriend is trying to take their child from her mother, and she is worried about this because he is abusing drugs. She also reports having h/o opiate and benzo abuse and wants help with this while she is in the hospital. Other stressors include uncertainty about where she will live after discharge. She would like to live with her mother, Laura Mcintosh, but said her mother is angry with her now and told her she couldn't stay there.  She shared she had poor interactions with the father of her child recently. She endorsed overdosing on medications on 9/2  with intent to harm herself and she tried to cut her wrists. She endorsed taking "uppers and downers" to the ED physician which included clonidine, cocaine and Suboxone. While on her way to the hospital, the pt reported she considered driving off a bridge to harm herself. She denied HI and AVH. She denies a previous h/o SI.   [name] stated yesterday in the ER, "I lost the 3 most important people to me." She was tearful, making poor eye contact and shared she lost her boyfriend of 5 years (he is the father of her 2yo son), her mother "who has always been there for me" and her 2yo son. About 2 days ago, she broke with her boyfriend. She discussed her boyfriend and her mother now want nothing to do with her but this provider was unable to understand why they do not want anything to do with the patient.  Due to these losses, the pt attempted suicide yesterday as noted above. The pt was not remorseful of the event and wished she was successful in completing suicide. She shared she wanted to cut her wrists but she as too afraid to do so."I feel like a horrible mother." However today patient is requesting treatment for substance abuse. Patient wants to get clean and better in order to be with her son. Patient stated that when she was pregnant and in soon after she had her child she was doing very well however she relapsed after she got together with her son's father who is a drug abuser.  For the past several days, the pt endorses the  following: decreased sleep; insomnia; guilt; anhedonia; decreased energy; decreased concentration; decreased appetite and suicidal ideation.   When driving to the ER, she thought about driving off of the [place] bridge but did not do so because she thought about her son. She notes he is the only reason I am alive from day to day.   Patient states that she started having issues with depression around age of 7 and 83. She feels that she is not worth it is a disappointment  for her family. Frequently she feels hopeless.  Substance abuse history: Patient reports that at the age of 44 and 60 she is started experimenting with drugs. Later on this progressed to opiates which she has been using since the age of 15. She denies IV drug use. For the last year patient has been taking Suboxone off the street along with her boyfriend. The patient states she uses every day and her last use was September 1. The patient states that a couple times a month she will also use Xanax Klonopin and Valium's. The last use was Thursday, September 1. The patient states she has experimented with a multitude of drugs "most of them" but denies ever meant it with crack or meth.  Patient states that on Thursday was the first time she has ever used cocaine. The patient smokes about 7 cigarettes a day and smokes marijuana daily.   HPI Elements: Quality: worsening. Severity: severe. Timing: the past 2 days. Duration: several days. Context: due to break up with boyfriend.   Total Time spent with patient: 1 hour  Past Psychiatric History: She denied to me ever having any psychiatric treatment for mental illness or substance abuse. States she has never been hospitalized has never had any self-injurious behaviors or suicidal attempts.  However she told Dr. Ellery Plunk in the emergency department that she had been diagnosed with ADHD and anxiety and insomnia in the past and that she had seen a psychiatrist in [place] about 3 years ago. She reported to him taking Adderall, being Xanax and trazodone   Past Medical History: Denies history of seizures, head trauma. He reports having tonsillectomy but no other surgeries. Denies having chronic medical conditions  Family History: Her father abuses drugs and alcohol. Her grandfather was an alcoholic.  Social History: Patient was living with her-49-year-old child and his father prior to admission. She however received an eviction notice from her boyfriend  on Thursday prior to admission. She took HER-38-year-old and went to her mother's house after that but her mother told her that she was not welcome to stay there. The patient's son is currently staying with her mother.  Patient's mother has threatened with calling child protective services if patient doesn't get help.  Patient was working as a Programme researcher, broadcasting/film/video at [employer]. The patient completed high school.   History  Alcohol Use No     History  Drug Use  . Yes  . Special: Benzodiazepines, Hydrocodone, Oxycodone    Social History   Social History  . Marital Status: Single    Spouse Name: N/A  . Number of Children: N/A  . Years of Education: N/A   Social History Main Topics  . Smoking status: Current Every Day Smoker -- 0.25 packs/day  . Smokeless tobacco: None  . Alcohol Use: No  . Drug Use: Yes    Special: Benzodiazepines, Hydrocodone, Oxycodone  . Sexual Activity: Yes    Birth Control/ Protection: Implant   Other Topics Concern  . None   Social History  Narrative     Musculoskeletal: Strength & Muscle Tone: within normal limits Gait & Station: normal Patient leans: N/A  Psychiatric Specialty Exam: Physical Exam  Review of Systems  HENT: Negative.   Eyes: Negative.   Respiratory: Negative.   Cardiovascular: Negative.   Gastrointestinal: Positive for nausea, abdominal pain and diarrhea.  Genitourinary: Negative.   Musculoskeletal: Positive for myalgias.  Skin: Negative.   Neurological: Positive for weakness.  Endo/Heme/Allergies: Negative.   Psychiatric/Behavioral: Positive for depression, suicidal ideas and substance abuse. The patient is nervous/anxious and has insomnia.     Blood pressure 108/70, pulse 69, temperature 99.1 F (37.3 C), temperature source Oral, resp. rate 20, height 5' 1"  (1.549 m), weight 48.988 kg (108 lb), last menstrual period 09/15/2014.Body mass index is 20.42 kg/(m^2).  General Appearance: Disheveled  Eye Sport and exercise psychologist::  Fair  Speech:  Normal Rate   Volume:  Normal  Mood:  Dysphoric and Irritable  Affect:  Constricted  Thought Process:  Linear and Logical  Orientation:  Full (Time, Place, and Person)  Thought Content:  Hallucinations: None  Suicidal Thoughts:  No  Homicidal Thoughts:  No  Memory:  Immediate;   Good Recent;   Good Remote;   Good  Judgement:  Fair  Insight:  Fair  Psychomotor Activity:  Increased  Concentration:  Good  Recall:  NA  Fund of Knowledge:Good  Language: Good  Akathisia:  No  Handed:    AIMS (if indicated):     Assets:  Communication Skills Desire for Improvement Physical Health Social Support Talents/Skills Vocational/Educational  ADL's:  Intact  Cognition: WNL  Sleep:  Number of Hours: 6.75    Allergies:  No Known Allergies   Lab Results:  Results for orders placed or performed during the hospital encounter of 09/29/14 (from the past 48 hour(s))  Comprehensive metabolic panel     Status: Abnormal   Collection Time: 09/29/14  9:24 AM  Result Value Ref Range   Sodium 139 135 - 145 mmol/L   Potassium 4.3 3.5 - 5.1 mmol/L    Comment: HEMOLYSIS AT THIS LEVEL MAY AFFECT RESULT   Chloride 105 101 - 111 mmol/L   CO2 25 22 - 32 mmol/L   Glucose, Bld 92 65 - 99 mg/dL   BUN 16 6 - 20 mg/dL   Creatinine, Ser 0.96 0.44 - 1.00 mg/dL   Calcium 9.6 8.9 - 10.3 mg/dL   Total Protein 8.4 (H) 6.5 - 8.1 g/dL   Albumin 5.2 (H) 3.5 - 5.0 g/dL   AST 31 15 - 41 U/L    Comment: HEMOLYSIS AT THIS LEVEL MAY AFFECT RESULT   ALT 14 14 - 54 U/L   Alkaline Phosphatase 47 38 - 126 U/L   Total Bilirubin 1.6 (H) 0.3 - 1.2 mg/dL    Comment: HEMOLYSIS AT THIS LEVEL MAY AFFECT RESULT   GFR calc non Af Amer >60 >60 mL/min   GFR calc Af Amer >60 >60 mL/min    Comment: (NOTE) The eGFR has been calculated using the CKD EPI equation. This calculation has not been validated in all clinical situations. eGFR's persistently <60 mL/min signify possible Chronic Kidney Disease.    Anion gap 9 5 - 15  Ethanol (ETOH)      Status: None   Collection Time: 09/29/14  9:24 AM  Result Value Ref Range   Alcohol, Ethyl (B) <5 <5 mg/dL    Comment:        LOWEST DETECTABLE LIMIT FOR SERUM ALCOHOL IS 5 mg/dL FOR MEDICAL  PURPOSES ONLY   CBC     Status: Abnormal   Collection Time: 09/29/14  9:24 AM  Result Value Ref Range   WBC 11.3 (H) 3.6 - 11.0 K/uL   RBC 4.40 3.80 - 5.20 MIL/uL   Hemoglobin 13.0 12.0 - 16.0 g/dL   HCT 38.2 35.0 - 47.0 %   MCV 86.7 80.0 - 100.0 fL   MCH 29.6 26.0 - 34.0 pg   MCHC 34.1 32.0 - 36.0 g/dL   RDW 13.1 11.5 - 14.5 %   Platelets 232 150 - 440 K/uL  Urine Drug Screen, Qualitative ([facility] only)     Status: Abnormal   Collection Time: 09/29/14 12:02 PM  Result Value Ref Range   Tricyclic, Ur Screen NONE DETECTED NONE DETECTED   Amphetamines, Ur Screen NONE DETECTED NONE DETECTED   MDMA (Ecstasy)Ur Screen NONE DETECTED NONE DETECTED   Cocaine Metabolite,Ur [place] NONE DETECTED NONE DETECTED   Opiate, Ur Screen NONE DETECTED NONE DETECTED   Phencyclidine (PCP) Ur S NONE DETECTED NONE DETECTED   Cannabinoid 50 Ng, Ur [place] POSITIVE (A) NONE DETECTED   Barbiturates, Ur Screen POSITIVE (A) NONE DETECTED   Benzodiazepine, Ur Scrn POSITIVE (A) NONE DETECTED   Methadone Scn, Ur NONE DETECTED NONE DETECTED    Comment: (NOTE) 500  Tricyclics, urine               Cutoff 1000 ng/mL 200  Amphetamines, urine             Cutoff 1000 ng/mL 300  MDMA (Ecstasy), urine           Cutoff 500 ng/mL 400  Cocaine Metabolite, urine       Cutoff 300 ng/mL 500  Opiate, urine                   Cutoff 300 ng/mL 600  Phencyclidine (PCP), urine      Cutoff 25 ng/mL 700  Cannabinoid, urine              Cutoff 50 ng/mL 800  Barbiturates, urine             Cutoff 200 ng/mL 900  Benzodiazepine, urine           Cutoff 200 ng/mL 1000 Methadone, urine                Cutoff 300 ng/mL 1100 1200 The urine drug screen provides only a preliminary, unconfirmed 1300 analytical test result and should not be used for  non-medical 1400 purposes. Clinical consideration and professional judgment should 1500 be applied to any positive drug screen result due to possible 1600 interfering substances. A more specific alternate chemical method 1700 must be used in order to obtain a confirmed analytical result.  1800 Gas chromato graphy / mass spectrometry (GC/MS) is the preferred 1900 confirmatory method.   Pregnancy, urine     Status: None   Collection Time: 09/29/14 12:02 PM  Result Value Ref Range   Preg Test, Ur NEGATIVE NEGATIVE   Current Medications: Current Facility-Administered Medications  Medication Dose Route Frequency Provider Last Rate Last Dose  . acetaminophen (TYLENOL) tablet 650 mg  650 mg Oral Q6H PRN Donita Brooks, MD      . alum & mag hydroxide-simeth (MAALOX/MYLANTA) 200-200-20 MG/5ML suspension 30 mL  30 mL Oral Q4H PRN Donita Brooks, MD      . cloNIDine (CATAPRES) tablet 0.1 mg  0.1 mg Oral TID PRN Hildred Priest, MD      .  hydrOXYzine (ATARAX/VISTARIL) tablet 50 mg  50 mg Oral TID WC & HS Hildred Priest, MD      . ibuprofen (ADVIL,MOTRIN) tablet 600 mg  600 mg Oral TID Hildred Priest, MD      . Influenza vac split quadrivalent PF (FLUARIX) injection 0.5 mL  0.5 mL Intramuscular Tomorrow-1000 Donita Brooks, MD      . loperamide (IMODIUM) capsule 2 mg  2 mg Oral TID Hildred Priest, MD      . magnesium hydroxide (MILK OF MAGNESIA) suspension 30 mL  30 mL Oral Daily PRN Donita Brooks, MD      . nicotine (NICODERM CQ - dosed in mg/24 hours) patch 14 mg  14 mg Transdermal Daily Hildred Priest, MD      . pantoprazole (PROTONIX) EC tablet 20 mg  20 mg Oral Daily Hildred Priest, MD      . promethazine (PHENERGAN) tablet 12.5 mg  12.5 mg Oral TID Hildred Priest, MD      . traZODone (DESYREL) tablet 100 mg  100 mg Oral QHS Hildred Priest, MD       PTA Medications: No prescriptions prior to admission      Results for orders placed or performed during the hospital encounter of 09/29/14 (from the past 72 hour(s))  Comprehensive metabolic panel     Status: Abnormal   Collection Time: 09/29/14  9:24 AM  Result Value Ref Range   Sodium 139 135 - 145 mmol/L   Potassium 4.3 3.5 - 5.1 mmol/L    Comment: HEMOLYSIS AT THIS LEVEL MAY AFFECT RESULT   Chloride 105 101 - 111 mmol/L   CO2 25 22 - 32 mmol/L   Glucose, Bld 92 65 - 99 mg/dL   BUN 16 6 - 20 mg/dL   Creatinine, Ser 0.96 0.44 - 1.00 mg/dL   Calcium 9.6 8.9 - 10.3 mg/dL   Total Protein 8.4 (H) 6.5 - 8.1 g/dL   Albumin 5.2 (H) 3.5 - 5.0 g/dL   AST 31 15 - 41 U/L    Comment: HEMOLYSIS AT THIS LEVEL MAY AFFECT RESULT   ALT 14 14 - 54 U/L   Alkaline Phosphatase 47 38 - 126 U/L   Total Bilirubin 1.6 (H) 0.3 - 1.2 mg/dL    Comment: HEMOLYSIS AT THIS LEVEL MAY AFFECT RESULT   GFR calc non Af Amer >60 >60 mL/min   GFR calc Af Amer >60 >60 mL/min    Comment: (NOTE) The eGFR has been calculated using the CKD EPI equation. This calculation has not been validated in all clinical situations. eGFR's persistently <60 mL/min signify possible Chronic Kidney Disease.    Anion gap 9 5 - 15  Ethanol (ETOH)     Status: None   Collection Time: 09/29/14  9:24 AM  Result Value Ref Range   Alcohol, Ethyl (B) <5 <5 mg/dL    Comment:        LOWEST DETECTABLE LIMIT FOR SERUM ALCOHOL IS 5 mg/dL FOR MEDICAL PURPOSES ONLY   CBC     Status: Abnormal   Collection Time: 09/29/14  9:24 AM  Result Value Ref Range   WBC 11.3 (H) 3.6 - 11.0 K/uL   RBC 4.40 3.80 - 5.20 MIL/uL   Hemoglobin 13.0 12.0 - 16.0 g/dL   HCT 38.2 35.0 - 47.0 %   MCV 86.7 80.0 - 100.0 fL   MCH 29.6 26.0 - 34.0 pg   MCHC 34.1 32.0 - 36.0 g/dL   RDW 13.1 11.5 - 14.5 %  Platelets 232 150 - 440 K/uL  Urine Drug Screen, Qualitative ([facility] only)     Status: Abnormal   Collection Time: 09/29/14 12:02 PM  Result Value Ref Range   Tricyclic, Ur Screen NONE DETECTED NONE DETECTED    Amphetamines, Ur Screen NONE DETECTED NONE DETECTED   MDMA (Ecstasy)Ur Screen NONE DETECTED NONE DETECTED   Cocaine Metabolite,Ur [place] NONE DETECTED NONE DETECTED   Opiate, Ur Screen NONE DETECTED NONE DETECTED   Phencyclidine (PCP) Ur S NONE DETECTED NONE DETECTED   Cannabinoid 50 Ng, Ur [place] POSITIVE (A) NONE DETECTED   Barbiturates, Ur Screen POSITIVE (A) NONE DETECTED   Benzodiazepine, Ur Scrn POSITIVE (A) NONE DETECTED   Methadone Scn, Ur NONE DETECTED NONE DETECTED    Comment: (NOTE) 500  Tricyclics, urine               Cutoff 1000 ng/mL 200  Amphetamines, urine             Cutoff 1000 ng/mL 300  MDMA (Ecstasy), urine           Cutoff 500 ng/mL 400  Cocaine Metabolite, urine       Cutoff 300 ng/mL 500  Opiate, urine                   Cutoff 300 ng/mL 600  Phencyclidine (PCP), urine      Cutoff 25 ng/mL 700  Cannabinoid, urine              Cutoff 50 ng/mL 800  Barbiturates, urine             Cutoff 200 ng/mL 900  Benzodiazepine, urine           Cutoff 200 ng/mL 1000 Methadone, urine                Cutoff 300 ng/mL 1100 1200 The urine drug screen provides only a preliminary, unconfirmed 1300 analytical test result and should not be used for non-medical 1400 purposes. Clinical consideration and professional judgment should 1500 be applied to any positive drug screen result due to possible 1600 interfering substances. A more specific alternate chemical method 1700 must be used in order to obtain a confirmed analytical result.  1800 Gas chromato graphy / mass spectrometry (GC/MS) is the preferred 1900 confirmatory method.   Pregnancy, urine     Status: None   Collection Time: 09/29/14 12:02 PM  Result Value Ref Range   Preg Test, Ur NEGATIVE NEGATIVE      Treatment Plan Summary: Daily contact with patient to assess and evaluate symptoms and progress in treatment and Medication management   24 year old with severe history of opiate and cannabis dependence dependence and  benzodiazepine abuse who presented to our emergency department after a suicidal attempt by overdose due to having an argument with the father of her child and after receiving records from her mother who was telling her she was planning on calling child protective services if she didn't get help for her substance abuse.  Major depressive disorder: It is possible the patient is experiencing opiate-induced depressive disorder, patient reports issues with depression prior to the use of opiates.  For now will hold off on restarting antidepressives however when she is more stable we'll consider starting an SSRI.  SSRIs can cause diarrhea and headache worsening withdrawal symptoms.  Insomnia: Patient will receive trazodone 100 mg by mouth daily at bedtime  Opiate withdrawal: Patient will receive symptomatic treatment with Vistaril, clonidine, Imodium, Phenergan, trazodone, ibuprofen.  Tobacco  use disorder: Patient will be started on a nicotine patch 14 mg a day  Diet regular  Precautions continue every 15 minute checks  Discharge planning: Currently homeless. Patient thinks that her mother will allow her to return when she sees she is getting treatment for substance abuse.  Patient is only interested in outpatient substance abuse treatment once discharged.   Medical Decision Making:  Established Problem, Worsening (2)  I certify that inpatient services furnished can reasonably be expected to improve the patient's condition.   Hildred Priest 9/4/20161:11 PM

## 2014-09-29 NOTE — Tx Team (Signed)
Initial Interdisciplinary Treatment Plan   PATIENT STRESSORS: Marital or family conflict Substance abuse   PATIENT STRENGTHS: Ability for insight Communication skills   PROBLEM LIST: Problem List/Patient Goals Date to be addressed Date deferred Reason deferred Estimated date of resolution  Substance abuse      Depression                                                 DISCHARGE CRITERIA:  Improved stabilization in mood, thinking, and/or behavior Withdrawal symptoms are absent or subacute and managed without 24-hour nursing intervention  PRELIMINARY DISCHARGE PLAN: Attend 12-step recovery group Outpatient therapy  PATIENT/FAMIILY INVOLVEMENT: This treatment plan has been presented to and reviewed with the patient, Laura Mcintosh.  The patient and family have been given the opportunity to ask questions and make suggestions.  Emberley Kral, Sarajane Marek 09/29/2014, 6:14 PM

## 2014-09-29 NOTE — ED Notes (Signed)
BEHAVIORAL HEALTH ROUNDING Patient sleeping: No. Patient alert and oriented: yes Behavior appropriate: Yes.  ; If no, describe:   Nutrition and fluids offered: Yes  and Patient declined meal. Toileting and hygiene offered: Yes  Sitter present: yes Law enforcement present: Yes

## 2014-09-29 NOTE — Progress Notes (Signed)
Pt seclusive to her room. Denies suicidal thoughts at this time. No interaction with peers. Sad affect. Trazodone for sleep.

## 2014-09-29 NOTE — ED Notes (Addendum)
Social worker with patient. Patient remains tearful. Lunch has been provided. Patient is not eating at this time.

## 2014-09-29 NOTE — ED Notes (Signed)
Reports wanting "to end it all". Tearful at triage.

## 2014-09-29 NOTE — Progress Notes (Signed)
Patient admitted to behavioral med unit from ED due to SI. She states she is here because she "was hopeless and ready to end it all, and I needed to get myself clean." Patient reports having an argument with her boyfriend today and having to leave their home because of this. She sent her 25-year-old son to stay with her mother. She said her boyfriend is trying to take their child from her mother, and she is worried about this because he is abusing drugs. She also reports having h/o opiate and benzo abuse and wants help with this while she is in the hospital. Other stressors include uncertainty about where she will live after discharge. She would like to live with her mother, Durwin Glaze, but said her mother is angry with her now and told her she couldn't stay there. Patient has a disheveled appearance. She is tearful at times, especially when talking about her child. Behavior is calm and cooperative. Skin assessment completed. No contraband found on skin check. Skin is warm, dry and intact. Will continue to monitor.

## 2014-09-30 ENCOUNTER — Encounter: Payer: Self-pay | Admitting: Psychiatry

## 2014-09-30 DIAGNOSIS — F17200 Nicotine dependence, unspecified, uncomplicated: Secondary | ICD-10-CM

## 2014-09-30 DIAGNOSIS — F1320 Sedative, hypnotic or anxiolytic dependence, uncomplicated: Secondary | ICD-10-CM

## 2014-09-30 DIAGNOSIS — F322 Major depressive disorder, single episode, severe without psychotic features: Principal | ICD-10-CM

## 2014-09-30 DIAGNOSIS — F1193 Opioid use, unspecified with withdrawal: Secondary | ICD-10-CM

## 2014-09-30 DIAGNOSIS — F1220 Cannabis dependence, uncomplicated: Secondary | ICD-10-CM

## 2014-09-30 DIAGNOSIS — F1120 Opioid dependence, uncomplicated: Secondary | ICD-10-CM

## 2014-09-30 MED ORDER — TRAZODONE HCL 100 MG PO TABS
100.0000 mg | ORAL_TABLET | Freq: Every day | ORAL | Status: DC
  Administered 2014-09-30: 100 mg via ORAL
  Filled 2014-09-30: qty 1

## 2014-09-30 MED ORDER — CLONIDINE HCL 0.1 MG PO TABS: 0.1000 mg | ORAL_TABLET | Freq: Three times a day (TID) | ORAL | Status: DC | PRN

## 2014-09-30 MED ORDER — IBUPROFEN 600 MG PO TABS
600.0000 mg | ORAL_TABLET | Freq: Three times a day (TID) | ORAL | Status: DC
  Administered 2014-09-30 – 2014-10-02 (×4): 600 mg via ORAL
  Filled 2014-09-30 (×4): qty 1

## 2014-09-30 MED ORDER — PANTOPRAZOLE SODIUM 40 MG PO TBEC
40.0000 mg | DELAYED_RELEASE_TABLET | Freq: Every day | ORAL | Status: DC
  Administered 2014-09-30 – 2014-10-02 (×3): 40 mg via ORAL
  Filled 2014-09-30 (×3): qty 1

## 2014-09-30 MED ORDER — NICOTINE 14 MG/24HR TD PT24
14.0000 mg | MEDICATED_PATCH | Freq: Every day | TRANSDERMAL | Status: DC
  Administered 2014-09-30 – 2014-10-02 (×2): 14 mg via TRANSDERMAL
  Filled 2014-09-30 (×2): qty 1

## 2014-09-30 MED ORDER — PANTOPRAZOLE SODIUM 20 MG PO TBEC
20.0000 mg | DELAYED_RELEASE_TABLET | Freq: Every day | ORAL | Status: DC
  Filled 2014-09-30 (×2): qty 1

## 2014-09-30 MED ORDER — LOPERAMIDE HCL 2 MG PO CAPS
2.0000 mg | ORAL_CAPSULE | Freq: Three times a day (TID) | ORAL | Status: DC
  Administered 2014-09-30 (×2): 2 mg via ORAL
  Filled 2014-09-30 (×3): qty 1

## 2014-09-30 MED ORDER — HYDROXYZINE HCL 50 MG PO TABS
50.0000 mg | ORAL_TABLET | Freq: Three times a day (TID) | ORAL | Status: DC
  Administered 2014-09-30 – 2014-10-01 (×3): 50 mg via ORAL
  Filled 2014-09-30 (×3): qty 1

## 2014-09-30 MED ORDER — PROMETHAZINE HCL 25 MG PO TABS
12.5000 mg | ORAL_TABLET | Freq: Three times a day (TID) | ORAL | Status: DC
  Administered 2014-09-30: 12.5 mg via ORAL
  Administered 2014-09-30: 25 mg via ORAL
  Administered 2014-10-01: 12.5 mg via ORAL
  Filled 2014-09-30 (×4): qty 1

## 2014-09-30 NOTE — [facility] Group Notes (Signed)
[facility] Group Notes:  (Nursing/MHT/Case Management/Adjunct)  Date:  09/30/2014  Time:  8:48 AM  Type of Therapy:  Group Therapy  Participation Level:  Did Not Attend  Summary of Progress/Problems:  Laura Mcintosh 09/30/2014, 8:48 AM

## 2014-09-30 NOTE — [facility] Group Notes (Addendum)
[facility] LCSW Group Therapy  09/30/2014 3:15 PM  Type of Therapy:  Group Therapy  Participation Level:  Minimal  Participation Quality:  Attentive  Affect:  Depressed  Cognitive:  Alert  Insight:  Limited  Engagement in Therapy:  Limited  Modes of Intervention:  Discussion, Education, Socialization and Support  Summary of Progress/Problems: Todays topic: Grudges  Patients will be encouraged to discuss their thoughts, feelings, and behaviors as to why one holds on to grudges and reasons why people have grudges. Patients will process the impact of grudges on their daily lives and identify thoughts and feelings related to holding grudges. Patients will identify feelings and thoughts related to what life would look like without grudges. Laura Mcintosh attended group and stayed the entire time. She sat quietly and listened to other group members    [employer] MSW, LCSWA  09/30/2014, 3:15 PM
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
## 2014-09-30 NOTE — Progress Notes (Signed)
Pt seclusive to her room. States she started feeling withdrawal sx today but medication is helping. Denies suicidal thoughts at this time.

## 2014-09-30 NOTE — Progress Notes (Signed)
Pt seclusive to her room. Denies suicidal thoughts at this time. No interaction with peers. Sad affect.  Complaining of cramping in stomach.  Meds given as ordered.

## 2014-10-01 MED ORDER — TRAZODONE HCL 50 MG PO TABS: 150.0000 mg | ORAL_TABLET | Freq: Every day | ORAL | Status: DC

## 2014-10-01 MED ORDER — HYDROXYZINE HCL 50 MG PO TABS: 50.0000 mg | ORAL_TABLET | Freq: Four times a day (QID) | ORAL | Status: DC | PRN

## 2014-10-01 NOTE — Plan of Care (Signed)
Problem: Ineffective individual coping Goal: STG: Patient will remain free from self harm Outcome: Progressing Pt safe on the unit  Problem: Alteration in mood & ability to function due to Goal: LTG-Patient demonstrates decreased signs of withdrawal (Patient demonstrates decreased signs of withdrawal to the point the patient is safe to return home and continue treatment in an outpatient setting)  Outcome: Progressing Pt denies and does not appear to be having any withdrawal Sx.  Goal: STG: Patient verbalizes decreases in signs of withdrawal Outcome: Progressing Pt stated she is not having any withdrawal Sx

## 2014-10-01 NOTE — Plan of Care (Signed)
Problem: Alteration in mood & ability to function due to Goal: STG-Patient will attend groups Outcome: Progressing Patient was able to get out of bed and attend some groups today. Patient in day room interacting and watching TV with peers.

## 2014-10-01 NOTE — Progress Notes (Signed)
[facility] MD Progress Note  10/01/2014 1:32 PM Laura Mcintosh  MRN:  [id number] Subjective:  Patient appeared irritated, she stated that she did not sleep any last night. She described her mood as angry and sad. She says she was able to talk to her mother on the phone yesterday. She was told that her son is doing okay. Her mother is allowing her to return there after discharged from the hospital.  Patient continues to report severe symptoms of opiate withdrawal. Abdominal cramps and diarrhea continued.  Appetite energy and concentration are poor. Denies SI, HI or auditory or visual hallucinations.  Per nursing: Pt seclusive to her room. Denies suicidal thoughts at this time. No interaction with peers. Sad affect. Complaining of cramping in stomach. Meds given as ordered.   Principal Problem: Severe major depression, single episode, without psychotic features Diagnosis:   Patient Active Problem List   Diagnosis Date Noted  . Severe major depression, single episode, without psychotic features [F32.2] 09/30/2014  . Tobacco use disorder [Z72.0] 09/30/2014  . Cannabis use disorder, severe, dependence [F12.20] 09/30/2014  . Moderate benzodiazepine use disorder [F13.90] 09/30/2014  . Opioid use disorder, severe, dependence [F11.20] 09/30/2014  . Opioid use with withdrawal [F11.93] 09/30/2014   Total Time spent with patient: 30 minutes   Past Medical History: History reviewed. No pertinent past medical history. History reviewed. No pertinent past surgical history. Family History: History reviewed. No pertinent family history. Social History:  History  Alcohol Use No     History  Drug Use  . Yes  . Special: Benzodiazepines, Hydrocodone, Oxycodone    Social History   Social History  . Marital Status: Single    Spouse Name: N/A  . Number of Children: N/A  . Years of Education: N/A   Social History Main Topics  . Smoking status: Current Every Day Smoker -- 0.25 packs/day  . Smokeless  tobacco: None  . Alcohol Use: No  . Drug Use: Yes    Special: Benzodiazepines, Hydrocodone, Oxycodone  . Sexual Activity: Yes    Birth Control/ Protection: Implant   Other Topics Concern  . None   Social History Narrative   Additional History:    Sleep: Poor  Appetite:  Fair   Assessment:   Musculoskeletal: Strength & Muscle Tone: within normal limits Gait & Station: normal Patient leans: N/A   Psychiatric Specialty Exam: Physical Exam  Review of Systems  Constitutional: Negative.   HENT: Negative.   Eyes: Negative.   Respiratory: Negative.   Cardiovascular: Negative.   Gastrointestinal: Positive for abdominal pain.  Genitourinary: Negative.   Musculoskeletal: Positive for myalgias.  Skin: Negative.   Neurological: Negative.   Endo/Heme/Allergies: Negative.   Psychiatric/Behavioral: Positive for depression and substance abuse. Negative for suicidal ideas. The patient has insomnia.     Blood pressure 105/69, pulse 73, temperature 98.7 F (37.1 C), temperature source Oral, resp. rate 18, height  (1.549 m), weight 48.988 kg (108 lb), last menstrual period 09/15/2014.Body mass index is 20.42 kg/(m^2).  General Appearance: Disheveled  Eye Contact::  Good  Speech:  Normal Rate  Volume:  Normal  Mood:  Dysphoric and Irritable  Affect:  Constricted  Thought Process:  Linear  Orientation:  Full (Time, Place, and Person)  Thought Content:  Hallucinations: None  Suicidal Thoughts:  No  Homicidal Thoughts:  No  Memory:  Immediate;   Good Recent;   Good Remote;   Good  Judgement:  Fair  Insight:  Fair  Psychomotor Activity:  Decreased  Concentration:  Good  Recall:  NA  Fund of Knowledge:Good  Language: Good  Akathisia:  No  Handed:    AIMS (if indicated):     Assets:  Solicitor Physical Health Social Support Talents/Skills Vocational/Educational  ADL's:  Intact  Cognition: WNL  Sleep:  Number of  Hours: 8.5     Current Medications: Current Facility-Administered Medications  Medication Dose Route Frequency Provider Last Rate Last Dose  . acetaminophen (TYLENOL) tablet 650 mg  650 mg Oral Q6H PRN Gena Fray, MD      . alum & mag hydroxide-simeth (MAALOX/MYLANTA) 200-200-20 MG/5ML suspension 30 mL  30 mL Oral Q4H PRN Gena Fray, MD      . cloNIDine (CATAPRES) tablet 0.1 mg  0.1 mg Oral TID PRN Jimmy Footman, MD      . hydrOXYzine (ATARAX/VISTARIL) tablet 50 mg  50 mg Oral Q6H PRN Jimmy Footman, MD      . ibuprofen (ADVIL,MOTRIN) tablet 600 mg  600 mg Oral TID Jimmy Footman, MD   600 mg at 10/01/14 1018  . Influenza vac split quadrivalent PF (FLUARIX) injection 0.5 mL  0.5 mL Intramuscular Tomorrow-1000 Gena Fray, MD   0.5 mL at 09/30/14 1000  . loperamide (IMODIUM) capsule 2 mg  2 mg Oral TID Jimmy Footman, MD   2 mg at 09/30/14 2140  . magnesium hydroxide (MILK OF MAGNESIA) suspension 30 mL  30 mL Oral Daily PRN Gena Fray, MD      . nicotine (NICODERM CQ - dosed in mg/24 hours) patch 14 mg  14 mg Transdermal Daily Jimmy Footman, MD   14 mg at 09/30/14 1358  . pantoprazole (PROTONIX) EC tablet 40 mg  40 mg Oral Daily Jimmy Footman, MD   40 mg at 10/01/14 1018  . promethazine (PHENERGAN) tablet 12.5 mg  12.5 mg Oral TID Jimmy Footman, MD   12.5 mg at 10/01/14 1017  . traZODone (DESYREL) tablet 150 mg  150 mg Oral QHS Jimmy Footman, MD        Lab Results: No results found for this or any previous visit (from the past 48 hour(s)).  Physical Findings: AIMS:  , ,  ,  ,    CIWA:    COWS:     Treatment Plan Summary: Daily contact with patient to assess and evaluate symptoms and progress in treatment and Medication management   24 year old with severe history of opiate and cannabis dependence dependence and benzodiazepine abuse who presented to our emergency department after a  suicidal attempt by overdose due to having an argument with the father of her child and after receiving records from her mother who was telling her she was planning on calling child protective services if she didn't get help for her substance abuse.  Major depressive disorder: It is possible the patient is experiencing opiate-induced depressive disorder, patient reports issues with depression prior to the use of opiates. For now will hold off on restarting antidepressives however when she is more stable we'll consider starting an SSRI. SSRIs can cause diarrhea and headache worsening withdrawal symptoms.  Insomnia: Trazodone will be increased to 150 mg by mouth daily at bedtime as patient was not able to sleep last night.  Opiate withdrawal: Patient will receive symptomatic treatment with Vistaril, clonidine, Imodium, Phenergan, trazodone, ibuprofen.  Tobacco use disorder: Patient will be started on a nicotine patch 14 mg a day  Diet regular  Precautions continue every 15 minute checks  Discharge planning: Currently homeless. Patient  thinks that her mother will allow her to return when she sees she is getting treatment for substance abuse. Patient is only interested in outpatient substance abuse treatment once discharged.   Medical Decision Making:  Established Problem, Stable/Improving (1)     Jimmy Footman 10/01/2014, 1:32 PM

## 2014-10-01 NOTE — BHH Group Notes (Signed)
[facility] LCSW Aftercare Discharge Planning Group Note   10/01/2014 11:41 AM  Participation Quality:  Patient participated in group appropriately and shared that her SMART goal is to "figure out why I can't stay awake". Patient was able to share her idea of wellness in group and related to other group members.   Mood/Affect:  Depressed  Thoughts of Suicide:  No Will you contract for safety?   NA  Current AVH:  No    Lulu Riding, MSW, [employer]

## 2014-10-01 NOTE — Progress Notes (Signed)
Recreation Therapy Notes  Date: 09.05.16 Time: 3:00 pm Location: Craft Room  Group Topic: Wellness  Goal Area(s) Addresses:  Patient will identify at least one item per dimension of health. Patient will examine areas they are deficient in.  Behavioral Response: Did not attend  Intervention: 6 Dimensions of Health  Activity: Patients were given a worksheet with the definitions of the 6 dimensions of health. Patients were given a worksheet with the 6 dimensions on it and instructed to list 2-3 things per each item.   Education: LRT educated patients on how the activity was related to their admission and d/c.   Education Outcome: Patient did not attend group.  Clinical Observations/Feedback: Patient did not attend group.  Jaquae Rieves M, LRT/CTRS 10/01/2014 4:10 PM 

## 2014-10-01 NOTE — Progress Notes (Signed)
The patient states that she is very tired during shift but states that she will feel better if she can stay awake. Patient denies SI and HI. Patient states that her period started today.

## 2014-10-01 NOTE — Progress Notes (Signed)
Patient denies SI/HI. Patient states that she is feeling better but is drowsy. Patient attended group. Patient watched TV interacted with peers in day. Safety maintained during shift. Continue plan and Q15 min checks.

## 2014-10-01 NOTE — Plan of Care (Signed)
Problem: Alteration in mood & ability to function due to Goal: STG: Patient verbalizes decreases in signs of withdrawal Outcome: Progressing Patient states that she is not feeling nausea or diarrhea.  Will continue to monitor.

## 2014-10-01 NOTE — Progress Notes (Signed)
D: Pt denies SI/HI/AVH. Pt is pleasant and cooperative. Pt isolates to her room, but stated she was not having any withdrawal Sx. Pt only complaint ws drowsy during the day.   A: Pt was offered support and encouragement. Pt was given scheduled medications. Pt was encourage to attend groups. Q 15 minute checks were done for safety. Pt educated on the effects of Vistaril.   R:Pt attends groups and interacts well with peers and staff.  Pt has no complaints at this time .Pt receptive to treatment and safety maintained on unit.

## 2014-10-01 NOTE — BHH Group Notes (Signed)
[facility] LCSW Group Therapy  10/01/2014 2:38 PM  Type of Therapy:  Group Therapy  Participation Level:  Did Not Attend   Lulu Riding, MSW, LCSWA 10/01/2014, 2:38 PM

## 2014-10-01 NOTE — Progress Notes (Signed)
Recreation Therapy Notes  INPATIENT RECREATION THERAPY ASSESSMENT  Patient Details Name: Laura Mcintosh MRN: [id number] DOB: 12-10-1990 Today's Date: 10/01/2014  Patient Stressors:  Patient reported no stressors.  Coping Skills:   Substance Abuse, Art/Dance, Talking, Music, Sports, Other (Comment) (Read)  Personal Challenges: Communication, Concentration, Decision-Making, Relationships, Self-Esteem/Confidence, Stress Management, Substance Abuse, Trusting Others  Leisure Interests (2+):  Individual - Other (Comment) (Be with son, go out to eat)  Awareness of Community Resources:  Yes  Community Resources:  Park  Current Use: Yes  If no, Barriers?:    Patient Strengths:  Eyes and smile  Patient Identified Areas of Improvement:  Coping skills for when she is craving  Current Recreation Participation:  Playing with son  Patient Goal for Hospitalization:  To get the drugs out of her system and go home  [place] of Residence:  [place] of Residence:  [place]   Current SI (including self-harm):  No  Current HI:  No  Consent to Intern Participation: N/A   Jacquelynn Cree, LRT/CTRS 10/01/2014, 4:30 PM

## 2014-10-02 MED ORDER — IBUPROFEN 600 MG PO TABS: 600.0000 mg | ORAL_TABLET | Freq: Four times a day (QID) | ORAL | Status: DC | PRN

## 2014-10-02 MED ORDER — LOPERAMIDE HCL 2 MG PO CAPS: 2.0000 mg | ORAL_CAPSULE | ORAL | Status: DC | PRN

## 2014-10-02 MED ORDER — TRAZODONE HCL 150 MG PO TABS: 150.0000 mg | ORAL_TABLET | Freq: Every evening | ORAL | Status: DC | PRN

## 2014-10-02 MED ORDER — PROMETHAZINE HCL 25 MG PO TABS: 12.5000 mg | ORAL_TABLET | Freq: Four times a day (QID) | ORAL | Status: DC | PRN

## 2014-10-02 MED ORDER — TRAZODONE HCL 50 MG PO TABS: 150.0000 mg | ORAL_TABLET | Freq: Every evening | ORAL | Status: DC | PRN

## 2014-10-02 NOTE — Discharge Summary (Signed)
Physician Discharge Summary Note  Patient:  Laura Mcintosh is an 24 y.o., female MRN:  [id number] DOB:  [birth year] Patient phone:  [phone] (home)  Patient address:   [place] [address],  Total Time spent with patient: 30 minutes  Date of Admission:  09/29/2014 Date of Discharge: 10/02/2014  Reason for Admission:  Suicidality  Principal Problem: Severe major depression, single episode, without psychotic features Discharge Diagnoses: Patient Active Problem List   Diagnosis Date Noted  . Severe major depression, single episode, without psychotic features [F32.2] 09/30/2014  . Tobacco use disorder [Z72.0] 09/30/2014  . Cannabis use disorder, severe, dependence [F12.20] 09/30/2014  . Moderate benzodiazepine use disorder [F13.90] 09/30/2014  . Opioid use disorder, severe, dependence [F11.20] 09/30/2014  . Opioid use with withdrawal [F11.93] 09/30/2014    Musculoskeletal: Strength & Muscle Tone: within normal limits Gait & Station: normal Patient leans: N/A  Psychiatric Specialty Exam: Physical Exam  Review of Systems  Constitutional: Negative.   HENT: Negative.   Eyes: Negative.   Respiratory: Negative.   Cardiovascular: Negative.   Gastrointestinal: Negative.   Genitourinary: Negative.   Musculoskeletal: Negative.   Skin: Negative.   Neurological: Negative.   Endo/Heme/Allergies: Negative.   Psychiatric/Behavioral: Negative.     Blood pressure 103/51, pulse 78, temperature 98.5 F (36.9 C), temperature source Oral, resp. rate 18, height 5' 1"  (1.549 m), weight 48.988 kg (108 lb), last menstrual period 09/15/2014.Body mass index is 20.42 kg/(m^2).  General Appearance: Well Groomed  Engineer, water::  Good  Speech:  Clear and Coherent  Volume:  Normal  Mood:  Euthymic  Affect:  Congruent  Thought Process:  Linear  Orientation:  Full (Time, Place, and Person)  Thought Content:  Hallucinations: None  Suicidal Thoughts:  No  Homicidal Thoughts:  No   Memory:  Immediate;   Good Recent;   Good Remote;   Good  Judgement:  Fair  Insight:  Fair  Psychomotor Activity:  Normal  Concentration:  NA  Recall:  NA  Fund of Knowledge:Good  Language: Good  Akathisia:  No  Handed:    AIMS (if indicated):     Assets:  Communication Skills Desire for Improvement Financial Resources/Insurance Laura Mcintosh Transportation Vocational/Educational  ADL's:  Intact  Cognition: WNL  Sleep:  Number of Hours: 7.15   History of Present Illness: Laura Mcintosh is a 24 y.o. female patient admitted with tearfulness and stating she "wants to end it all." Pt broke up with her boyfriend 3 days ago. On 9/2, she attempted to overdose and cut her wrist with a dull razor. Yesterday the pt reported she wanted to jump off of a bridge.   Patient admitted to behavioral med unit from ED due to [place]. She states she is here because she "was hopeless and ready to end it all, and I needed to get myself clean." Patient reports having an argument with her boyfriend yesterday and having to leave their home because of this. She sent her 37-year-old son to stay with her mother. She said her boyfriend is trying to take their child from her mother, and she is worried about this because he is abusing drugs. She also reports having h/o opiate and benzo abuse and wants help with this while she is in the hospital. Other stressors include uncertainty about where she will live after discharge. She would like to live with her mother, Laura Mcintosh, but said her mother is angry with her now and told her she couldn't  stay there.  She shared she had poor interactions with the father of her child recently. She endorsed overdosing on medications on 9/2 with intent to harm herself and she tried to cut her wrists. She endorsed taking "uppers and downers" to the ED physician which included clonidine, cocaine and Suboxone. While on her way to the hospital,  the pt reported she considered driving off a bridge to harm herself. She denied HI and AVH. She denies a previous h/o SI.   [name] stated yesterday in the ER, "I lost the 3 most important people to me." She was tearful, making poor eye contact and shared she lost her boyfriend of 5 years (he is the father of her 2yo son), her mother "who has always been there for me" and her 2yo son. About 2 days ago, she broke with her boyfriend. She discussed her boyfriend and her mother now want nothing to do with her but this provider was unable to understand why they do not want anything to do with the patient.  Due to these losses, the pt attempted suicide yesterday as noted above. The pt was not remorseful of the event and wished she was successful in completing suicide. She shared she wanted to cut her wrists but she as too afraid to do so."I feel like a horrible mother." However today patient is requesting treatment for substance abuse. Patient wants to get clean and better in order to be with her son. Patient stated that when she was pregnant and in soon after she had her child she was doing very well however she relapsed after she got together with her son's father who is a drug abuser.  For the past several days, the pt endorses the following: decreased sleep; insomnia; guilt; anhedonia; decreased energy; decreased concentration; decreased appetite and suicidal ideation.   When driving to the ER, she thought about driving off of the [place] bridge but did not do so because she thought about her son. She notes he is the only reason I am alive from day to day.   Patient states that she started having issues with depression around age of 4 and 12. She feels that she is not worth it is a disappointment for her family. Frequently she feels hopeless.  Substance abuse history: Patient reports that at the age of 36 and 67 she is started experimenting with drugs. Later on this progressed to opiates which  she has been using since the age of 56. She denies IV drug use. For the last year patient has been taking Suboxone off the street along with her boyfriend. The patient states she uses every day and her last use was September 1. The patient states that a couple times a month she will also use Xanax Klonopin and Valium's. The last use was Thursday, September 1. The patient states she has experimented with a multitude of drugs "most of them" but denies ever meant it with crack or meth. Patient states that on Thursday was the first time she has ever used cocaine. The patient smokes about 7 cigarettes a day and smokes marijuana daily.   HPI Elements: Quality: worsening. Severity: severe. Timing: the past 2 days. Duration: several days. Context: due to break up with boyfriend.   Total Time spent with patient: 1 hour  Past Psychiatric History: She denied to me ever having any psychiatric treatment for mental illness or substance abuse. States she has never been hospitalized has never had any self-injurious behaviors or suicidal attempts. However  she told Dr. Ellery Plunk in the emergency department that she had been diagnosed with ADHD and anxiety and insomnia in the past and that she had seen a psychiatrist in [place] about 3 years ago. She reported to him taking Adderall, being Xanax and trazodone   Past Medical History: Denies history of seizures, head trauma. He reports having tonsillectomy but no other surgeries. Denies having chronic medical conditions  Family History: Her father abuses drugs and alcohol. Her grandfather was an alcoholic.  Social History: Patient was living with her-48-year-old child and his father prior to admission. She however received an eviction notice from her boyfriend on Thursday prior to admission. She took HER-73-year-old and went to her mother's house after that but her mother told her that she was not welcome to stay there. The patient's son is currently staying  with her mother. Patient's mother has threatened with calling child protective services if patient doesn't get help. Patient was working as a Programme researcher, broadcasting/film/video at [employer]. The patient completed high school.  History  Alcohol Use No    History  Drug Use  . Yes  . Special: Benzodiazepines, Hydrocodone, Oxycodone    Social History   Social History  . Marital Status: Single    Spouse Name: N/A  . Number of Children: N/A  . Years of Education: N/A   Social History Main Topics  . Smoking status: Current Every Day Smoker -- 0.25 packs/day  . Smokeless tobacco: None  . Alcohol Use: No  . Drug Use: Yes    Special: Benzodiazepines, Hydrocodone, Oxycodone  . Sexual Activity: Yes    Birth Control/ Protection: Implant         Hospital Course:   24 year old with severe history of opiate and cannabis dependence dependence and benzodiazepine abuse who presented to our emergency department after a suicidal attempt by overdose due to having an argument with the father of her child and after receiving records from her mother who was telling her she was planning on calling child protective services if she didn't get help for her substance abuse.  Major depressive disorder: It is possible the patient is experiencing opiate-induced depressive disorder, patient reports issues with depression prior to the use of opiates. For now will hold off on restarting antidepressives however when she is more stable we'll consider starting an SSRI. SSRIs can cause diarrhea and headache worsening withdrawal symptoms.  Insomnia: Trazodone will be increased to 150 mg by mouth daily at bedtime as patient was not able to sleep last night.  Opiate withdrawal: Patient will receive symptomatic treatment with Vistaril, clonidine, Imodium, Phenergan, trazodone, ibuprofen.  Tobacco use disorder: Patient will be started on a nicotine patch 14 mg a day  Diet  regular  Precautions continue every 15 minute checks  Discharge planning: Patient reports feeling much better. She plans to move in with her mother instead of going back with her boyfriend who is still using opiates. The patient's mood is euthymic and her affect is bright and reactive. The patient appears motivated for treatment. He displays good insight into her addiction. The patient is hopeful and future oriented. She denies any problems with sleep, appetite, energy or concentration. Denies any physical complaints. Denies any SE from meds.  There were no behavioral problems during her hospital stay, she was calm pleasant and cooperative.Marland Kitchen  Discharge Vitals:   Blood pressure 103/51, pulse 78, temperature 98.5 F (36.9 C), temperature source Oral, resp. rate 18, height 5' 1"  (1.549 m), weight 48.988 kg (108 lb), last  menstrual period 09/15/2014. Body mass index is 20.42 kg/(m^2).  Lab Results:   Results for LATRELL, REITAN (MRN [id number]) as of 10/02/2014 10:55  Ref. Range 09/29/2014 09:24 09/29/2014 12:02  Sodium Latest Ref Range: 135-145 mmol/L 139   Potassium Latest Ref Range: 3.5-5.1 mmol/L 4.3   Chloride Latest Ref Range: 101-111 mmol/L 105   CO2 Latest Ref Range: 22-32 mmol/L 25   BUN Latest Ref Range: 6-20 mg/dL 16   Creatinine Latest Ref Range: 0.44-1.00 mg/dL 0.96   Calcium Latest Ref Range: 8.9-10.3 mg/dL 9.6   EGFR (Non-African Amer.) Latest Ref Range: >60 mL/min >60   EGFR (African American) Latest Ref Range: >60 mL/min >60   Glucose Latest Ref Range: 65-99 mg/dL 92   Anion gap Latest Ref Range: 5-15  9   Alkaline Phosphatase Latest Ref Range: 38-126 U/L 47   Albumin Latest Ref Range: 3.5-5.0 g/dL 5.2 (H)   AST Latest Ref Range: 15-41 U/L 31   ALT Latest Ref Range: 14-54 U/L 14   Total Protein Latest Ref Range: 6.5-8.1 g/dL 8.4 (H)   Total Bilirubin Latest Ref Range: 0.3-1.2 mg/dL 1.6 (H)   WBC Latest Ref Range: 3.6-11.0 K/uL 11.3 (H)   RBC Latest Ref Range: 3.80-5.20 MIL/uL  4.40   Hemoglobin Latest Ref Range: 12.0-16.0 g/dL 13.0   HCT Latest Ref Range: 35.0-47.0 % 38.2   MCV Latest Ref Range: 80.0-100.0 fL 86.7   MCH Latest Ref Range: 26.0-34.0 pg 29.6   MCHC Latest Ref Range: 32.0-36.0 g/dL 34.1   RDW Latest Ref Range: 11.5-14.5 % 13.1   Platelets Latest Ref Range: 150-440 K/uL 232   Preg Test, Ur Latest Ref Range: NEGATIVE   NEGATIVE  Alcohol, Ethyl (B) Latest Ref Range: <5 mg/dL <5   Amphetamines, Ur Screen Latest Ref Range: NONE DETECTED   NONE DETECTED  Barbiturates, Ur Screen Latest Ref Range: NONE DETECTED   POSITIVE (A)  Benzodiazepine, Ur Scrn Latest Ref Range: NONE DETECTED   POSITIVE (A)  Cocaine Metabolite,Ur [place] Latest Ref Range: NONE DETECTED   NONE DETECTED  Methadone Scn, Ur Latest Ref Range: NONE DETECTED   NONE DETECTED  MDMA (Ecstasy)Ur Screen Latest Ref Range: NONE DETECTED   NONE DETECTED  Cannabinoid 50 Ng, Ur [place] Latest Ref Range: NONE DETECTED   POSITIVE (A)  Opiate, Ur Screen Latest Ref Range: NONE DETECTED   NONE DETECTED  Phencyclidine (PCP) Ur S Latest Ref Range: NONE DETECTED   NONE DETECTED  Tricyclic, Ur Screen Latest Ref Range: NONE DETECTED   NONE DETECTED      Medication List    TAKE these medications      Indication   traZODone 150 MG tablet  Commonly known as:  DESYREL  Take 1 tablet (150 mg total) by mouth at bedtime as needed for sleep.  Notes to Patient:  insomnia            Follow-up Information    Follow up with [facility] .   Why:  Your first appointment will be walk in. Monday-Friday between 8:00am and 4:00pm. Please take all of your hospital follow up information, ID and insurance information.    Contact information:   [address]  [place], [place] [address] [phone] or [phone] [phone]       Total Discharge Time: 30 minutes  Signed: Hildred Priest 10/02/2014, 10:51 AM

## 2014-10-02 NOTE — Progress Notes (Signed)
Recreation Therapy Notes  INPATIENT RECREATION TR PLAN  Patient Details Name: Laura Mcintosh MRN: [id number] DOB: July 18, 1990 Today's Date: 10/02/2014  Rec Therapy Plan Is patient appropriate for Therapeutic Recreation?: Yes Treatment times per week: At least once a week TR Treatment/Interventions: 1:1 session, Group participation (Comment) (Appropriate participation in daily recreation therapy tx)  Discharge Criteria Pt will be discharged from therapy if:: Discharged Treatment plan/goals/alternatives discussed and agreed upon by:: Patient/family  Discharge Summary Short term goals set: See Care Plan Short term goals met: Complete Progress toward goals comments: One-to-one attended One-to-one attended: Stress management, coping skills, decision making Reason goals not met: N/A Therapeutic equipment acquired: None Reason patient discharged from therapy: Discharge from hospital Pt/family agrees with progress & goals achieved: Yes Date patient discharged from therapy: 10/02/14   Leonette Monarch, LRT/CTRS 10/02/2014, 5:32 PM

## 2014-10-02 NOTE — Plan of Care (Signed)
Problem: [facility] Participation in Recreation Therapeutic Interventions Goal: STG-Patient will identify at least five coping skills for ** STG: Coping Skills - Within 3 treatment sessions, patient will verbalize at least 5 coping skills for substance abuse in one treatment session to decrease substance abuse post d/c.  Outcome: Completed/Met Date Met:  10/02/14 Treatment Session 1; Completed 1 out of 1: At approximately 9:55 am, LRT met with patient in patient room. Patient verbalized 5 coping skills for substance abuse. LRT educated patient on leisure and why it is important to implement into her schedule. LRT provided patient with blank sxhedules to help her plan her day and try to avoid using substances. LRT educated patient on healthy support systems and why they are important.  Leonette Monarch, LRT/CTRS 09.06.16 1:58 pm    Goal: STG-Other Recreation Therapy Goal (Specify) STG: Stress Management - Within 3 treatment sessions, patient will verbalize understanding of the stress management techniques in one treatment session to increase stress management skills post d/c.  Outcome: Completed/Met Date Met:  10/02/14 Treatment Session 1; Completed 1 out of 1: At approximately 9:55 am, LRT met with patient in patient room. LRT educated and provided patient with handouts on stress management techniques. Patient verbalized understanding. LRT encouraged patient to read over and practice the stress management techniques.   Leonette Monarch, LRT/CTRS 09.06.16 1:58 pm  Problem: [facility] Participation in Recreation Therapeutic Interventions Goal: STG-Other Recreation Therapy Goal (Specify) STG: Decision Making - Within 3 treatment sessions, patient will verbalize understanding of decision making charts in one treatment session to increase good decision making post d/c.  Outcome: Completed/Met Date Met:  10/02/14 Treatment Session 1; Completed 1 out of 1: At approximately 9:55 am, LRT met with patient in  patient room. LRT educated patient on two decision making charts. Patient verbalized understanding of both. LRT encouraged patient to use the decision making charts.  Leonette Monarch, LRT/CTRS 09.06.16 1:59 pm

## 2014-10-02 NOTE — Progress Notes (Signed)
D:Patient aware of discharge this shift . Patient returning home . Patient received all belonging locked up . Patient denies  Suicidal  And homicidal ideations  .  A: Writer instructed on discharge criteria  . Prescriptions  given to patient and instructions  . Aware  Of follow up appointment . R: Patient left unit with no questions  Or concerns  With family sister

## 2014-10-02 NOTE — Plan of Care (Signed)
Problem: Ineffective individual coping Goal: LTG: Patient will report a decrease in negative feelings Outcome: Progressing Patient working on Pharmacologist

## 2014-10-02 NOTE — [facility] Suicide Risk Assessment (Signed)
[facility] Discharge Suicide Risk Assessment   Demographic Factors:  Adolescent or young adult and Caucasian  Total Time spent with patient: 30 minutes   Psychiatric Specialty Exam: Physical Exam  ROS                                                         Have you used any form of tobacco in the last 30 days? (Cigarettes, Smokeless Tobacco, Cigars, and/or Pipes): Yes  Has this patient used any form of tobacco in the last 30 days? (Cigarettes, Smokeless Tobacco, Cigars, and/or Pipes) Yes, A prescription for an FDA-approved tobacco cessation medication was offered at discharge and the patient refused  Mental Status Per Nursing Assessment::   On Admission:  Self-harm thoughts  Current Mental Status by Physician: Mood is euthymic affect is bright and reactive. Patient reports feelings significantly better. Not having suicidal thoughts, homicidal thoughts. The patient is hopeful and future oriented. She plans to return to live with her son but instead of staying with her boyfriend she plans to stay with her mother  Loss Factors: Loss of significant relationship  Historical Factors: Impulsivity  Risk Reduction Factors:   Responsible for children under 32 years of age, Sense of responsibility to family, Living with another person, especially a relative and Positive social support  Continued Clinical Symptoms:  Alcohol/Substance Abuse/Dependencies  Cognitive Features That Contribute To Risk:  None    Suicide Risk:  Minimal: No identifiable suicidal ideation.  Patients presenting with no risk factors but with morbid ruminations; may be classified as minimal risk based on the severity of the depressive symptoms  Principal Problem: Severe major depression, single episode, without psychotic features Discharge Diagnoses:  Patient Active Problem List   Diagnosis Date Noted  . Severe major depression, single episode, without psychotic features [F32.2] 09/30/2014  .  Tobacco use disorder [Z72.0] 09/30/2014  . Cannabis use disorder, severe, dependence [F12.20] 09/30/2014  . Moderate benzodiazepine use disorder [F13.90] 09/30/2014  . Opioid use disorder, severe, dependence [F11.20] 09/30/2014  . Opioid use with withdrawal [F11.93] 09/30/2014    Follow-up Information    Follow up with [facility] .   Why:  Your first appointment will be walk in. Monday-Friday between 8:00am and 4:00pm. Please take all of your hospital follow up information, ID and insurance information.    Contact information:   [address]  [place], [place] [address] [phone] or [phone] Fax:[phone]       Is patient on multiple antipsychotic therapies at discharge:  No   Has Patient had three or more failed trials of antipsychotic monotherapy by history:  No  Recommended Plan for Multiple Antipsychotic Therapies: NA    Jimmy Footman 10/02/2014, 10:49 AM

## 2014-10-02 NOTE — Progress Notes (Signed)
  [facility] Adult Case Management Discharge Plan :  [facility]  No. At discharge, do you have transportation home?: Yes,    Do you have the ability to pay for your medications: Yes,     Release of information consent forms completed and in the chart;  Patient's signature needed at discharge.  Patient to Follow up at: Follow-up Information    Follow up with [facility] .   Why:  Your first appointment will be walk in. Monday-Friday between 8:00am and 4:00pm. Please take all of your hospital follow up information, ID and insurance information. Referral for [facility] information:   [address]  [place], [place] [address] Phone:[phone] or [phone] [phone]      Patient denies SI/HI: Yes,       Safety Planning and Suicide Prevention discussed: Yes,     Have you used any form of tobacco in the last 30 days? (Cigarettes, Smokeless Tobacco, Cigars, and/or Pipes): Yes  Has patient been referred to the Quitline?: Patient refused referral  Jarrett Chicoine, Cleda Daub, MSW, LCSW 10/02/2014, 2:12 PM

## 2014-10-10 ENCOUNTER — Ambulatory Visit: Payer: Self-pay | Admitting: Family Medicine

## 2015-07-29 IMAGING — CT CT ABD-PELV W/ CM
2 of 4 series · 16 of 46 positions shown, 18 images · IV contrast (agent unspecified)
Comparison: None.

CLINICAL DATA: Abdominal pain.

EXAM:
CT ABDOMEN AND PELVIS WITH CONTRAST
TECHNIQUE: Multidetector CT imaging of the abdomen and pelvis was performed
using the standard protocol following bolus administration of
intravenous contrast.
CONTRAST:  100 cc [id number]

[Series 2: routine abd pel with · axial · 0.72mm/px · z∈[-1084,-644]mm · 13 of 96 slices shown, 15 images]
[im 4/96  soft-tissue]
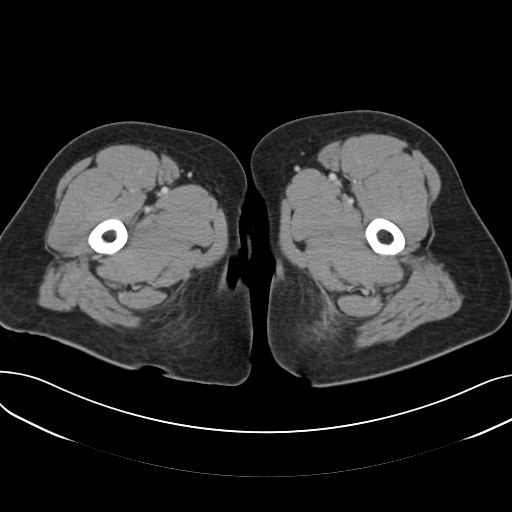
[im 4/96  bone]
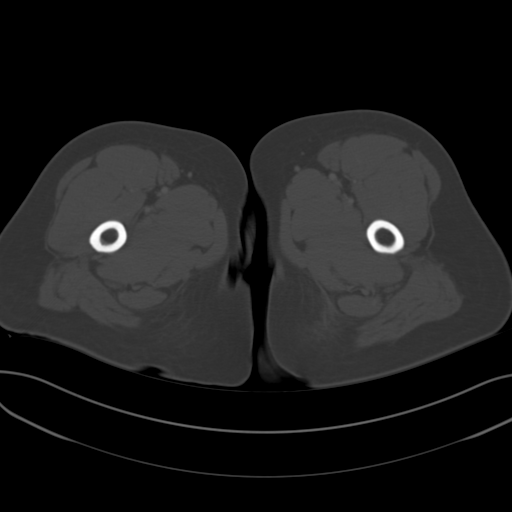
[im 12/96  soft-tissue]
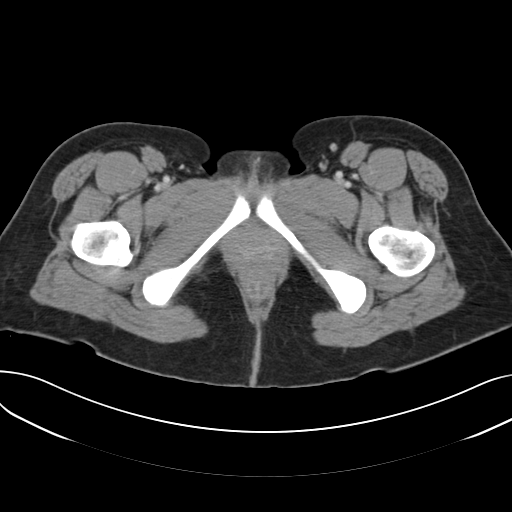
[im 20/96  soft-tissue]
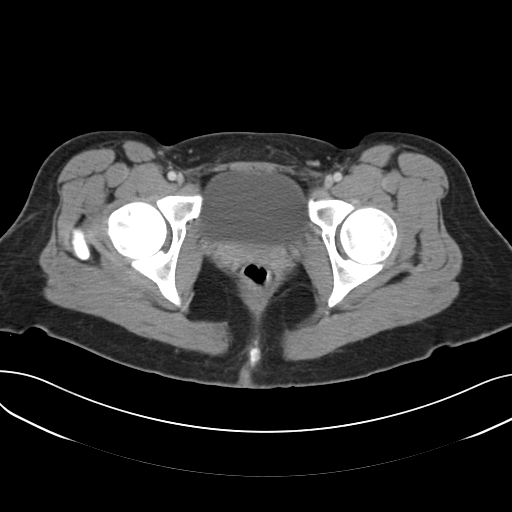
[im 27/96  soft-tissue]
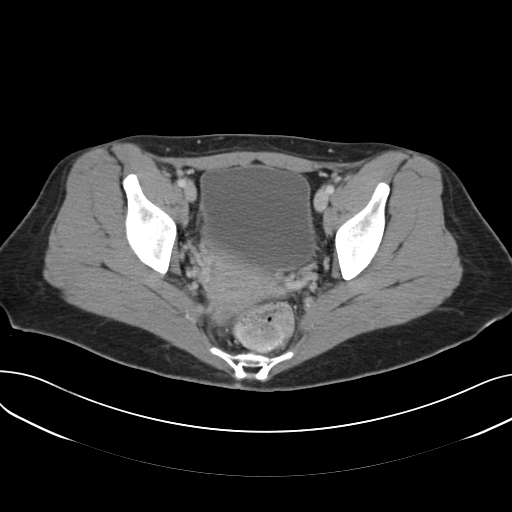
[im 35/96  soft-tissue]
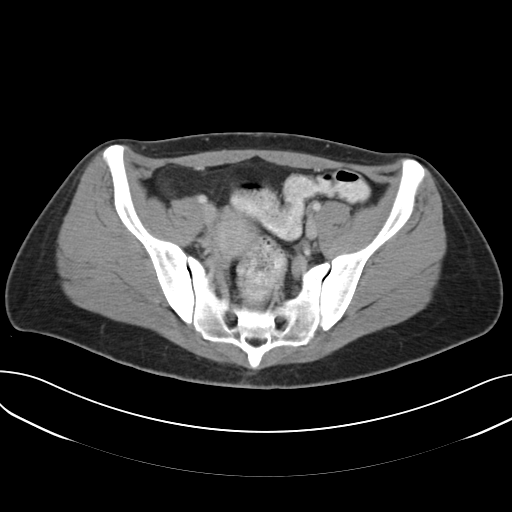
[im 42/96  soft-tissue]
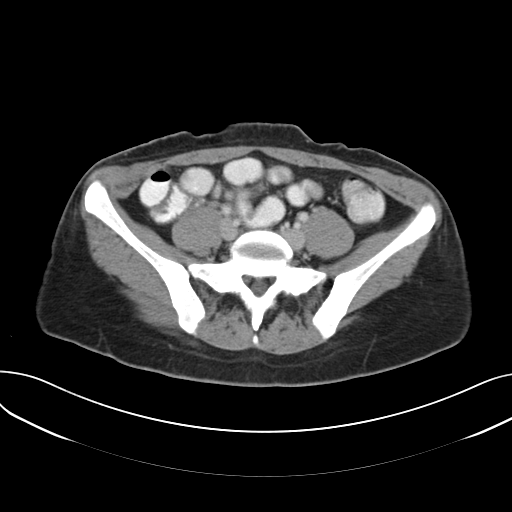
[im 50/96  soft-tissue]
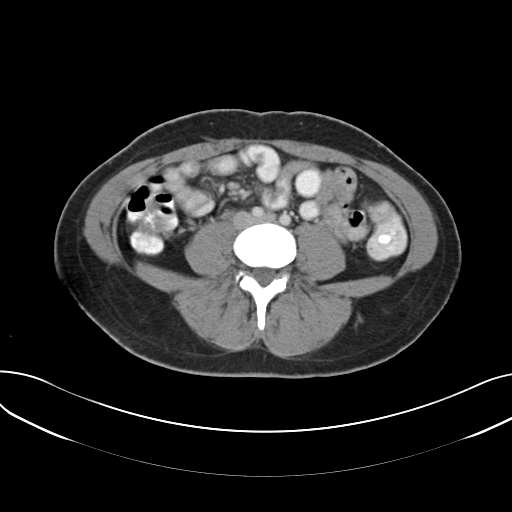
[im 54/96  soft-tissue]
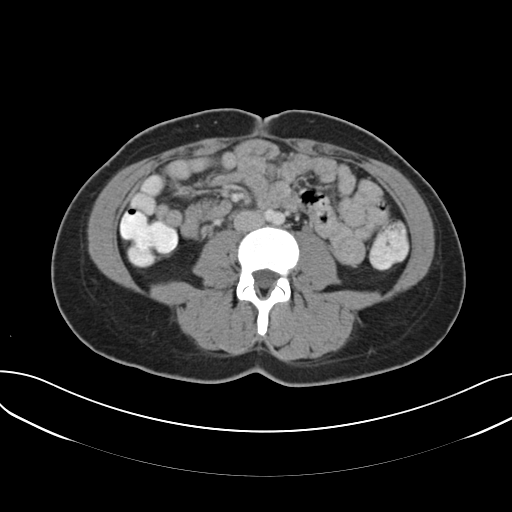
[im 61/96  soft-tissue]
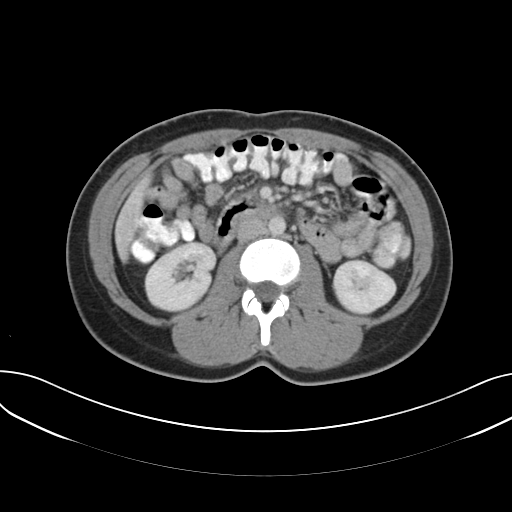
[im 61/96  bone]
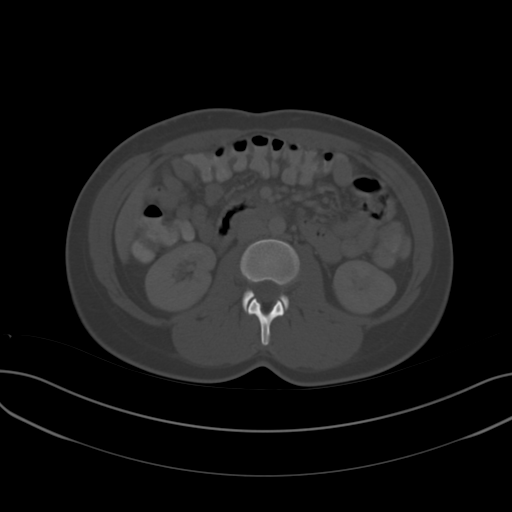
[im 69/96  soft-tissue]
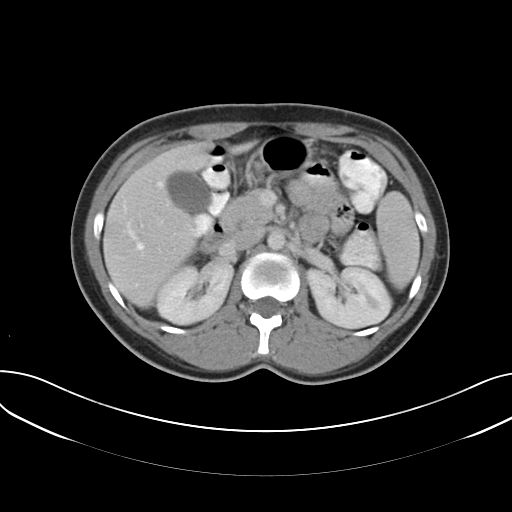
[im 77/96  soft-tissue]
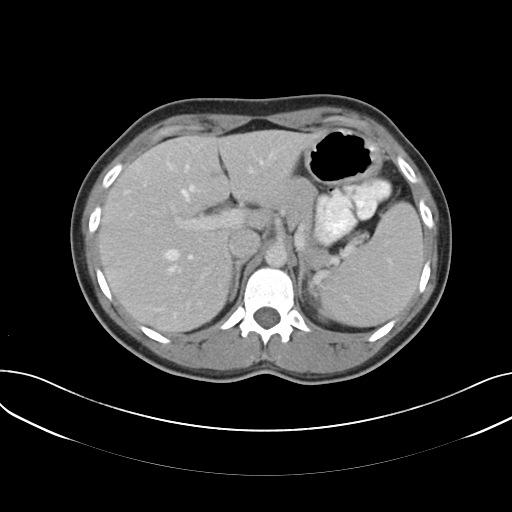
[im 84/96  soft-tissue]
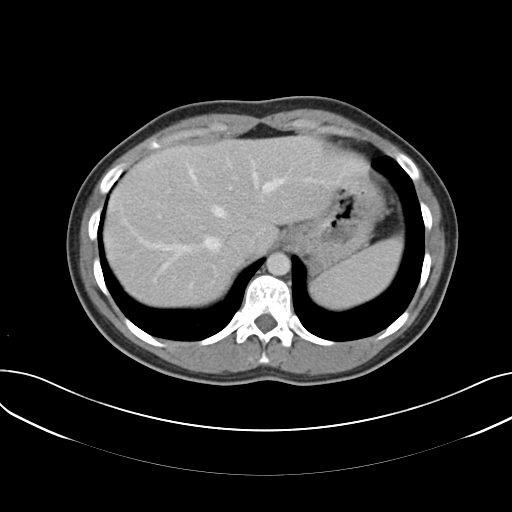
[im 92/96  soft-tissue]
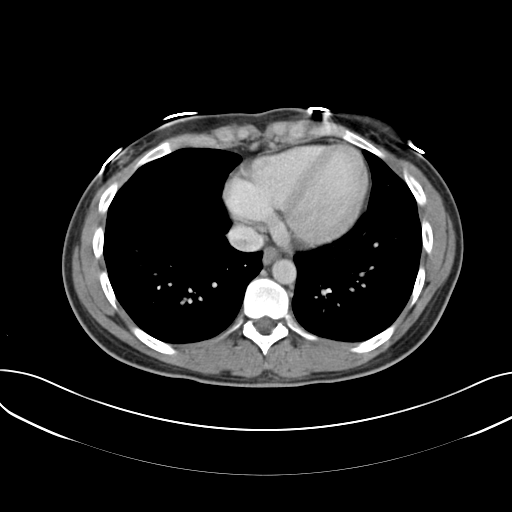

[Series 5: cor routine abd pel with · coronal · 0.70mm/px · 3 of 99 slices shown]
[im 33/99  soft-tissue]
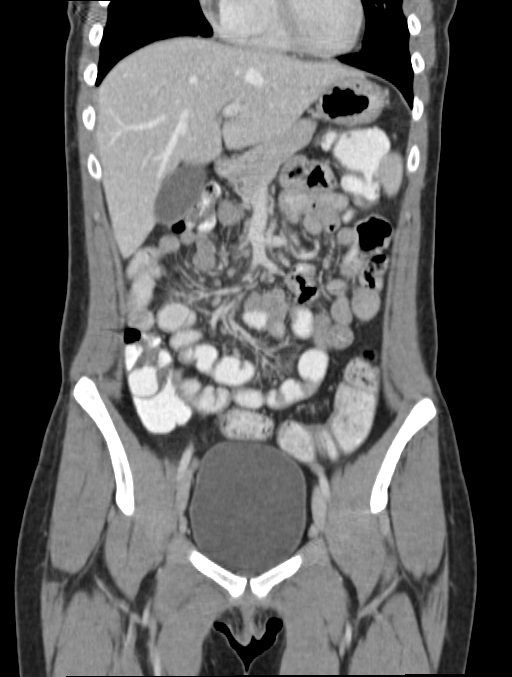
[im 44/99  soft-tissue]
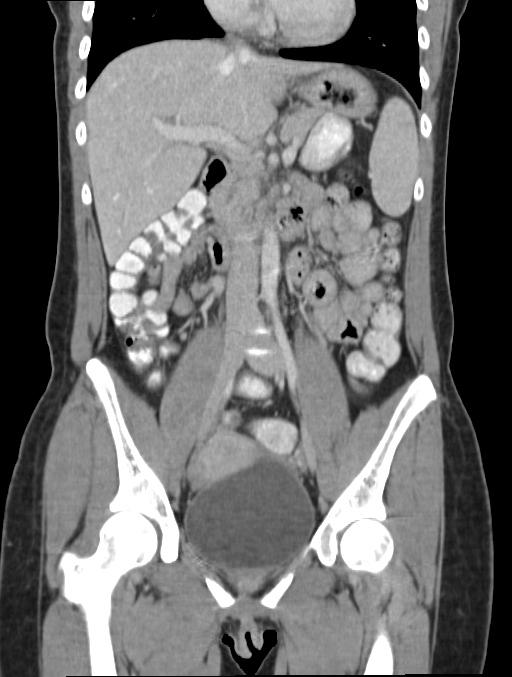
[im 55/99  soft-tissue]
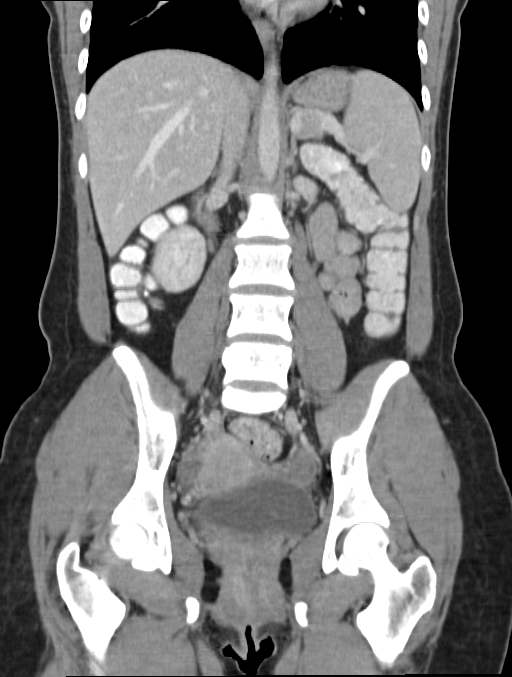

[16 of 46 positions shown; findings below may reference images not displayed]

FINDINGS: The lung bases are clear.  No pleural effusion.

The solid abdominal organs are normal. No inflammatory changes or
mass lesions. The gallbladder is normal. No common bile duct
dilatation.

The stomach, duodenum, small bowel and colon are unremarkable. No
inflammatory changes, mass lesions or obstructive findings. The
appendix is normal. No mesenteric or retroperitoneal mass or
adenopathy. The aorta and branch vessels are normal. The major
venous structures are patent.

Uterus and ovaries are normal. No pelvic mass, adenopathy or free
pelvic fluid collections. The bladder is normal. No inguinal mass or
adenopathy.

The bony structures are unremarkable.
IMPRESSION: Unremarkable CT abdomen/ pelvis. No acute abdominal/ pelvic
findings, mass lesions or adenopathy.

## 2015-08-14 LAB — HM PAP SMEAR

## 2018-04-22 ENCOUNTER — Ambulatory Visit: Payer: Self-pay | Admitting: Maternal Newborn

## 2018-05-25 ENCOUNTER — Ambulatory Visit: Payer: Self-pay | Admitting: Maternal Newborn

## 2018-06-30 ENCOUNTER — Encounter: Payer: Self-pay | Admitting: Maternal Newborn

## 2018-07-01 ENCOUNTER — Encounter: Payer: Self-pay | Admitting: Maternal Newborn

## 2018-07-01 ENCOUNTER — Other Ambulatory Visit: Payer: Self-pay

## 2018-07-01 ENCOUNTER — Other Ambulatory Visit (HOSPITAL_COMMUNITY)
Admission: RE | Admit: 2018-07-01 | Discharge: 2018-07-01 | Disposition: A | Discharge status: Home / Self Care | Payer: 59 | Source: Ambulatory Visit | Attending: Maternal Newborn | Admitting: Maternal Newborn

## 2018-07-01 ENCOUNTER — Ambulatory Visit (INDEPENDENT_AMBULATORY_CARE_PROVIDER_SITE_OTHER): Payer: 59 | Admitting: Maternal Newborn

## 2018-07-01 VITALS — BP 98/64 | Ht 60.0 in | Wt 143.2 lb

## 2018-07-01 DIAGNOSIS — Z113 Encounter for screening for infections with a predominantly sexual mode of transmission: Secondary | ICD-10-CM | POA: Insufficient documentation

## 2018-07-01 DIAGNOSIS — Z124 Encounter for screening for malignant neoplasm of cervix: Secondary | ICD-10-CM | POA: Insufficient documentation

## 2018-07-01 DIAGNOSIS — Z01419 Encounter for gynecological examination (general) (routine) without abnormal findings: Secondary | ICD-10-CM | POA: Diagnosis not present

## 2018-07-01 DIAGNOSIS — Z30011 Encounter for initial prescription of contraceptive pills: Secondary | ICD-10-CM

## 2018-07-01 DIAGNOSIS — Z30432 Encounter for removal of intrauterine contraceptive device: Secondary | ICD-10-CM

## 2018-07-01 DIAGNOSIS — B009 Herpesviral infection, unspecified: Secondary | ICD-10-CM

## 2018-07-01 DIAGNOSIS — Z3009 Encounter for other general counseling and advice on contraception: Secondary | ICD-10-CM

## 2018-07-01 MED ORDER — NORGESTIM-ETH ESTRAD TRIPHASIC 0.18/0.215/0.25 MG-25 MCG PO TABS: 1.0000 | ORAL_TABLET | Freq: Every day | ORAL | 11 refills | Status: DC

## 2018-07-01 MED ORDER — VALACYCLOVIR HCL 1 G PO TABS: 1000.0000 mg | ORAL_TABLET | Freq: Every day | ORAL | 3 refills | Status: AC

## 2018-07-01 NOTE — Progress Notes (Signed)
Gynecology Annual Exam  PCP: Patient, No Pcp Per  Chief Complaint:  Chief Complaint  Patient presents with  . Gynecologic Exam  . Contraception    would like IUD out, has gained weight, would like something hormone free    History of Present Illness: Patient is a 28 y.o. [id number] presenting for annual exam.   LMP: No LMP recorded. (Menstrual status: IUD). Average Interval: irregular Duration of flow: varies from one day of spotting to a week long cycle Heavy Menses: no Clots: no Intermenstrual Bleeding: no Postcoital Bleeding: no Dysmenorrhea: no  The patient is sexually active. She currently uses IUD for contraception. She does not have dyspareunia.  The patient does not perform self breast exams.  There is no notable family history of breast or ovarian cancer in her family.  The patient has regular exercise: yes.    The patient does not have current symptoms of depression.    Review of Systems  Constitutional:       Weight gain  HENT: Negative.   Eyes: Negative.   Respiratory: Negative for shortness of breath and wheezing.   Cardiovascular: Negative for chest pain and palpitations.  Gastrointestinal: Negative for abdominal pain, constipation and diarrhea.  Genitourinary: Negative.   Musculoskeletal: Negative.   Skin:       Acne  Neurological: Negative.   Endo/Heme/Allergies: Negative.   Psychiatric/Behavioral: Negative.   All other systems reviewed and are negative.   Past Medical History:  Past Medical History:  Diagnosis Date  . ADD (attention deficit disorder)   . GAD (generalized anxiety disorder)   . Herpes simplex vulvovaginitis   . Oral herpes   . Tension headache, chronic     Past Surgical History:  Past Surgical History:  Procedure Laterality Date  . TONSILLECTOMY     age 50    Gynecologic History:  No LMP recorded. (Menstrual status: IUD). Contraception: IUD Last Pap: Has had a Pap but uncertain when last Pap was done.  Obstetric  History: [id number]  Family History:  Family History  Problem Relation Age of Onset  . Diabetes Father   . Other Maternal Grandmother        bile duct cancer, skeletal disorder, spinal cancer  . Heart disease Maternal Grandfather   . Stomach cancer Maternal Grandfather     Social History:  Social History   Socioeconomic History  . Marital status: Single    Spouse name: Not on file  . Number of children: Not on file  . Years of education: Not on file  . Highest education level: Not on file  Occupational History  . Not on file  Social Needs  . Financial resource strain: Not on file  . Food insecurity:    Worry: Not on file    Inability: Not on file  . Transportation needs:    Medical: Not on file    Non-medical: Not on file  Tobacco Use  . Smoking status: Current Some Day Smoker    Packs/day: 0.25  . Smokeless tobacco: Former Engineer, water and Sexual Activity  . Alcohol use: No  . Drug use: Yes    Types: Benzodiazepines, Hydrocodone, Oxycodone  . Sexual activity: Yes    Birth control/protection: I.U.D.  Lifestyle  . Physical activity:    Days per week: Not on file    Minutes per session: Not on file  . Stress: Not on file  Relationships  . Social connections:    Talks on phone: Not on file  Gets together: Not on file    Attends religious service: Not on file    Active member of club or organization: Not on file    Attends meetings of clubs or organizations: Not on file    Relationship status: Not on file  . Intimate partner violence:    Fear of current or ex partner: Not on file    Emotionally abused: Not on file    Physically abused: Not on file    Forced sexual activity: Not on file  Other Topics Concern  . Not on file  Social History Narrative  . Not on file    Allergies:  No Known Allergies  Medications: Prior to Admission medications   Not on File    Physical Exam Vitals: Blood pressure 98/64, height 5' (1.524 m), weight 143 lb 3.2 oz (65  kg).  General: NAD HEENT: normocephalic, anicteric Thyroid: no enlargement Pulmonary: No increased work of breathing, CTAB Cardiovascular: RRR, no murmurs, rubs, or gallops Breast: Breasts symmetrical, no tenderness, no palpable nodules or masses, no skin or nipple retraction present, no nipple discharge.  No axillary or supraclavicular lymphadenopathy. Abdomen: Soft, non-tender, non-distended.  Umbilicus without lesions.  No hepatomegaly, splenomegaly or masses palpable. No evidence of hernia  Genitourinary:  External: Normal external female genitalia.  Normal urethral  meatus, normal Bartholin's and Skene's glands.    Vagina: Normal vaginal mucosa, no evidence of prolapse.    Cervix: Grossly normal in appearance, no bleeding  Uterus: Non-enlarged, mobile, normal contour.  No CMT  Adnexa: ovaries non-enlarged, no adnexal masses  Rectal: deferred  Lymphatic: no evidence of inguinal lymphadenopathy Extremities: no edema, erythema, or tenderness Neurologic: Grossly intact Psychiatric: mood appropriate, affect full  Assessment: 28 y.o. [id number] here for an annual exam and IUD removal.  Plan: Problem List Items Addressed This Visit    None    Visit Diagnoses    Women's annual routine gynecological examination    -  Primary   Pap smear for cervical cancer screening       Relevant Orders   Cytology - PAP   Screening examination for STD (sexually transmitted disease)       Relevant Orders   Cytology - PAP   Encounter for IUD removal       Encounter for counseling regarding contraception       Recurrent HSV (herpes simplex virus)       Relevant Medications   valACYclovir (VALTREX) 1000 MG tablet   Oral contraception initial prescription       Relevant Medications   Norgestimate-Ethinyl Estradiol Triphasic 0.18/0.215/0.25 MG-25 MCG tab      1) STI screening was offered and accepted.  2) ASCCP guidelines and rationale discussed.  Patient opts for every 3 year screening interval.  Pap done today.  3) Contraception - Desired IUD removal, done today and documented in a separate procedure note. Education given regarding options for contraception, including oral contraceptives. She has decided to start OCP. Rx sent to pharmacy.  4) Would like to schedule a weight loss visit with MD. She has gained 30 lbs since she has had her IUD in place. She is currently exercising an hour daily and has not lost any weight.  5) Rx sent for Valtrex; she occasionally has herpes labialis sores and prefers to use it for suppression as needed.  6) Follow up 1 year for an annual exam.  Marcelyn BruinsJacelyn , CNM 07/01/2018

## 2018-07-04 ENCOUNTER — Encounter: Payer: Self-pay | Admitting: Maternal Newborn

## 2018-07-04 NOTE — Progress Notes (Signed)
   GYNECOLOGY OFFICE PROCEDURE NOTE  Laura Mcintosh is a 28 y.o. [id number] here for IUD removal. She desires removal secondary to weight gain and irregular cycles.  IUD Removal  Patient identified, informed consent performed, consent signed.  Patient was in the dorsal lithotomy position, normal external genitalia was noted.  A speculum was placed in the patient's vagina, normal discharge was noted, no lesions. The cervix was visualized, no lesions, no abnormal discharge.  The strings of the IUD were grasped and pulled using ring forceps. The IUD was removed in its entirety.  Patient tolerated the procedure well.    Patient will use oral contraceptives for contraception. Routine preventative health maintenance measures emphasized.   Avel Sensor, CNM [facility], [facility]

## 2018-07-05 ENCOUNTER — Ambulatory Visit (INDEPENDENT_AMBULATORY_CARE_PROVIDER_SITE_OTHER): Payer: 59 | Admitting: Obstetrics & Gynecology

## 2018-07-05 ENCOUNTER — Other Ambulatory Visit: Payer: Self-pay

## 2018-07-05 ENCOUNTER — Encounter: Payer: Self-pay | Admitting: Obstetrics & Gynecology

## 2018-07-05 VITALS — BP 120/80 | Ht 60.0 in | Wt 145.0 lb

## 2018-07-05 DIAGNOSIS — E663 Overweight: Secondary | ICD-10-CM

## 2018-07-05 DIAGNOSIS — E8881 Metabolic syndrome: Secondary | ICD-10-CM | POA: Diagnosis not present

## 2018-07-05 LAB — CYTOLOGY - PAP
Chlamydia: NEGATIVE
Diagnosis: NEGATIVE
Neisseria Gonorrhea: NEGATIVE
Trichomonas: NEGATIVE

## 2018-07-05 MED ORDER — PHENTERMINE HCL 37.5 MG PO TABS: ORAL_TABLET | ORAL | 0 refills | Status: DC

## 2018-07-05 NOTE — Progress Notes (Signed)
  HPI:  Patient is a 28 y.o. [id number] presenting for evaluation of abnormal weight gain.  The patient has gained 25 pounds over the past years and attributes it to pregnancy then progesterone contraception these last few years; Nexplanon, then Mirena.  Recently change to OCPs to have reg periods and see how affects weight.  Planning pregnancy next year...  She feels she has gained weight due to problems with her nutrition and physical activity.  She has these associated symptoms: none.  She has tried self-directed dieting and gym based exercuse in the past with little success.  PMHx: She  has a past medical history of ADD (attention deficit disorder), GAD (generalized anxiety disorder), Herpes simplex vulvovaginitis, Oral herpes, and Tension headache, chronic. Also,  has a past surgical history that includes Tonsillectomy., family history includes Diabetes in her father; Heart disease in her maternal grandfather; Other in her maternal grandmother; Stomach cancer in her maternal grandfather.,  reports that she has been smoking. She has been smoking about 0.25 packs per day. She has quit using smokeless tobacco. She reports current drug use. Drugs: Benzodiazepines, Hydrocodone, and Oxycodone. She reports that she does not drink alcohol.  She has a current medication list which includes the following prescription(s): norgestimate-ethinyl estradiol triphasic, valacyclovir, and phentermine. Also, has No Known Allergies.  Review of Systems  All other systems reviewed and are negative.   Objective: BP 120/80   Ht 5' (1.524 m)   Wt 145 lb (65.8 kg)   LMP 07/05/2018   BMI 28.32 kg/m  Physical Exam Constitutional:      General: She is not in acute distress.    Appearance: She is well-developed.  Musculoskeletal: Normal range of motion.  Neurological:     Mental Status: She is alert and oriented to person, place, and time.  Skin:    General: Skin is warm and dry.  Vitals signs reviewed.      ASSESSMENT:   1. Overweight (BMI 25.0-29.9) 2. Dysmetabolic syndrome - phentermine (ADIPEX-P) 37.5 MG tablet; One tablet po in morning.  Dispense: 30 tablet; Refill: 0  Plan: Will assist patient in incorporating positive experiences into her life to promote a positive mental attitude.  Education given regarding appropriate lifestyle changes for weight loss, including regular physical activity, healthy coping strategies, caloric restriction, and healthy eating patterns.  Patient is started on prescription appetite suppressants: Phentermine.    The risks and benefits as well as side effects of medication, such as Phenteramine or Tenuate, is discussed.  The pros and cons of suppressing appetite and boosting metabolism is counseled.  Risks of tolerance and addiction discussed.  Use of medicine will be short term.  Pt to call with any negative side effects and agrees to keep follow up appointments.  A total of 15 minutes were spent face-to-face with the patient during this encounter and over half of that time dealt with counseling and coordination of care.  Barnett Applebaum, MD, [facility], [facility] 07/05/2018  4:01 PM

## 2018-07-05 NOTE — Patient Instructions (Signed)
Phentermine tablets or capsules What is this medicine? PHENTERMINE (FEN ter meen) decreases your appetite. It is used with a reduced calorie diet and exercise to help you lose weight. This medicine may be used for other purposes; ask your health care provider or pharmacist if you have questions. COMMON BRAND NAME(S): Adipex-P, Atti-Plex P, Atti-Plex P Spansule, Fastin, Lomaira, Pro-Fast, Tara-8 What should I tell my health care provider before I take this medicine? They need to know if you have any of these conditions: -agitation or nervousness -diabetes -glaucoma -heart disease -high blood pressure -history of drug abuse or addiction -history of stroke -kidney disease -lung disease called Primary Pulmonary Hypertension (PPH) -taken an MAOI like Carbex, Eldepryl, Marplan, Nardil, or Parnate in last 14 days -taking stimulant medicines for attention disorders, weight loss, or to stay awake -thyroid disease -an unusual or allergic reaction to phentermine, other medicines, foods, dyes, or preservatives -pregnant or trying to get pregnant -breast-feeding How should I use this medicine? Take this medicine by mouth with a glass of water. Follow the directions on the prescription label. The instructions for use may differ based on the product and dose you are taking. Avoid taking this medicine in the evening. It may interfere with sleep. Take your doses at regular intervals. Do not take your medicine more often than directed. Talk to your pediatrician regarding the use of this medicine in children. While this drug may be prescribed for children 17 years or older for selected conditions, precautions do apply. Overdosage: If you think you have taken too much of this medicine contact a poison control center or emergency room at once. NOTE: This medicine is only for you. Do not share this medicine with others. What if I miss a dose? If you miss a dose, take it as soon as you can. If it is almost time  for your next dose, take only that dose. Do not take double or extra doses. What may interact with this medicine? Do not take this medicine with any of the following medications: -MAOIs like Carbex, Eldepryl, Marplan, Nardil, and Parnate -medicines for colds or breathing difficulties like pseudoephedrine or phenylephrine -procarbazine -sibutramine -stimulant medicines for attention disorders, weight loss, or to stay awake This medicine may also interact with the following medications: -certain medicines for depression, anxiety, or psychotic disturbances -linezolid -medicines for diabetes -medicines for high blood pressure This list may not describe all possible interactions. Give your health care provider a list of all the medicines, herbs, non-prescription drugs, or dietary supplements you use. Also tell them if you smoke, drink alcohol, or use illegal drugs. Some items may interact with your medicine. What should I watch for while using this medicine? Notify your physician immediately if you become short of breath while doing your normal activities. Do not take this medicine within 6 hours of bedtime. It can keep you from getting to sleep. Avoid drinks that contain caffeine and try to stick to a regular bedtime every night. This medicine was intended to be used in addition to a healthy diet and exercise. The best results are achieved this way. This medicine is only indicated for short-term use. Eventually your weight loss may level out. At that point, the drug will only help you maintain your new weight. Do not increase or in any way change your dose without consulting your doctor. You may get drowsy or dizzy. Do not drive, use machinery, or do anything that needs mental alertness until you know how this medicine affects you. Do   not stand or sit up quickly, especially if you are an older patient. This reduces the risk of dizzy or fainting spells. Alcohol may increase dizziness and drowsiness.  Avoid alcoholic drinks. What side effects may I notice from receiving this medicine? Side effects that you should report to your doctor or health care professional as soon as possible: -allergic reactions like skin rash, itching or hives, swelling of the face, lips, or tongue) -anxiety -breathing problems -changes in vision -chest pain or chest tightness -depressed mood or other mood changes -hallucinations, loss of contact with reality -fast, irregular heartbeat -increased blood pressure -irritable -nervousness or restlessness -painful urination -palpitations -tremors -trouble sleeping -seizures -signs and symptoms of a stroke like changes in vision; confusion; trouble speaking or understanding; severe headaches; sudden numbness or weakness of the face, arm or leg; trouble walking; dizziness; loss of balance or coordination -unusually weak or tired -vomiting Side effects that usually do not require medical attention (report to your doctor or health care professional if they continue or are bothersome): -constipation or diarrhea -dry mouth -headache -nausea -stomach upset -sweating This list may not describe all possible side effects. Call your doctor for medical advice about side effects. You may report side effects to FDA at 1-800-FDA-1088. Where should I keep my medicine? Keep out of the reach of children. This medicine can be abused. Keep your medicine in a safe place to protect it from theft. Do not share this medicine with anyone. Selling or giving away this medicine is dangerous and against the law. This medicine may cause accidental overdose and death if taken by other adults, children, or pets. Mix any unused medicine with a substance like cat litter or coffee grounds. Then throw the medicine away in a sealed container like a sealed bag or a coffee can with a lid. Do not use the medicine after the expiration date. Store at room temperature between 20 and 25 degrees C (68 and  77 degrees F). Keep container tightly closed. NOTE: This sheet is a summary. It may not cover all possible information. If you have questions about this medicine, talk to your doctor, pharmacist, or health care provider.  2019 Elsevier/Gold Standard (2016-06-26 08:23:13)  

## 2018-07-08 ENCOUNTER — Telehealth: Payer: Self-pay

## 2018-07-08 NOTE — Telephone Encounter (Signed)
Pt calling c/o is on phentermine and has terrible headache at the end of the day.  She spoke to a doctor at a diff practice who said topamax would be good option.  Can we call in something to take c the phentermine to help c h/as?  [phone]

## 2018-07-10 NOTE — Telephone Encounter (Signed)
I was going to let you decide if you want to continue her and treat with topomax or discuss alternatives

## 2018-07-11 ENCOUNTER — Other Ambulatory Visit: Payer: Self-pay | Admitting: Obstetrics & Gynecology

## 2018-07-11 MED ORDER — TOPIRAMATE 25 MG PO TABS: 25.0000 mg | ORAL_TABLET | Freq: Two times a day (BID) | ORAL | 4 refills | Status: DC

## 2018-07-11 NOTE — Telephone Encounter (Signed)
Pt aware.

## 2018-07-11 NOTE — Telephone Encounter (Signed)
Let her know; we will add Topamax 25 mg BID while taking Phentermine, to help w headaches (also helps w weight loss) ERx done

## 2018-07-11 NOTE — Telephone Encounter (Signed)
Patient is returning missed call. Patient is at work ok to leave detailed message

## 2018-07-11 NOTE — Telephone Encounter (Signed)
Left message to return call 

## 2018-08-02 ENCOUNTER — Ambulatory Visit (INDEPENDENT_AMBULATORY_CARE_PROVIDER_SITE_OTHER): Payer: 59 | Admitting: Obstetrics & Gynecology

## 2018-08-02 ENCOUNTER — Encounter: Payer: Self-pay | Admitting: Obstetrics & Gynecology

## 2018-08-02 ENCOUNTER — Other Ambulatory Visit: Payer: Self-pay

## 2018-08-02 VITALS — BP 110/70 | Ht 61.0 in | Wt 138.0 lb

## 2018-08-02 DIAGNOSIS — E8881 Metabolic syndrome: Secondary | ICD-10-CM

## 2018-08-02 DIAGNOSIS — Z30011 Encounter for initial prescription of contraceptive pills: Secondary | ICD-10-CM

## 2018-08-02 DIAGNOSIS — E663 Overweight: Secondary | ICD-10-CM | POA: Diagnosis not present

## 2018-08-02 MED ORDER — NORGESTIM-ETH ESTRAD TRIPHASIC 0.18/0.215/0.25 MG-25 MCG PO TABS: 1.0000 | ORAL_TABLET | Freq: Every day | ORAL | 1 refills | Status: DC

## 2018-08-02 MED ORDER — PHENTERMINE HCL 37.5 MG PO TABS: ORAL_TABLET | ORAL | 1 refills | Status: DC

## 2018-08-02 NOTE — Progress Notes (Signed)
  History of Present Illness:  Laura Mcintosh is a 28 y.o. who was started on Phentermine approximately 4 weeks ago due to obesity/abnormal weight gain. The patient has lost 7 pounds over the past month due to lifestyle changes and meds; has to take 1/2 pill twice a day due to headache and crash feeling after 4 hours.  Did not tolerate Topamax after one day..   She has these side effects: dry mouth and headache.  PMHx: She  has a past medical history of ADD (attention deficit disorder), GAD (generalized anxiety disorder), Herpes simplex vulvovaginitis, Oral herpes, and Tension headache, chronic. Also,  has a past surgical history that includes Tonsillectomy., family history includes Diabetes in her father; Heart disease in her maternal grandfather; Other in her maternal grandmother; Stomach cancer in her maternal grandfather.,  reports that she has been smoking. She has been smoking about 0.25 packs per day. She has quit using smokeless tobacco. She reports current drug use. Drugs: Benzodiazepines, Hydrocodone, and Oxycodone. She reports that she does not drink alcohol.  She has a current medication list which includes the following prescription(s): norgestimate-ethinyl estradiol triphasic and phentermine. Also, has No Known Allergies.  Review of Systems  All other systems reviewed and are negative.   Physical Exam:  BP 110/70   Ht 5\' 1"  (1.549 m)   Wt 138 lb (62.6 kg)   LMP 07/05/2018   BMI 26.07 kg/m  Body mass index is 26.07 kg/m. Filed Weights   08/02/18 0815  Weight: 138 lb (62.6 kg)    Physical Exam Constitutional:      General: She is not in acute distress.    Appearance: She is well-developed.  Musculoskeletal: Normal range of motion.  Neurological:     Mental Status: She is alert and oriented to person, place, and time.  Skin:    General: Skin is warm and dry.  Vitals signs reviewed.    Assessment:  1. Dysmetabolic syndrome - phentermine (ADIPEX-P) 37.5 MG tablet; One  tablet po in morning.  Dispense: 30 tablet; Refill: 1  2. Overweight (BMI 25.0-29.9) - phentermine (ADIPEX-P) 37.5 MG tablet; One tablet po in morning.  Dispense: 30 tablet; Refill: 1  3. Oral contraception initial prescription - Norgestimate-Ethinyl Estradiol Triphasic 0.18/0.215/0.25 MG-25 MCG tab; Take 1 tablet by mouth daily.  Dispense: 1 Package; Refill: 1  Medication treatment is going well for her.  Plan: Patient is continued/added to prescription appetite suppressants: Phentermine 1/2 pill twice daily, self-directed dieting and Exercise.   Will continue to assist patient in incorporating positive experiences into her life to promote a positive mental attitude.  Education given regarding appropriate lifestyle changes for weight loss, including regular physical activity, healthy coping strategies, caloric restriction, and healthy eating patterns.  Patient doing well with weight loss in progress. Will stop medicine after 2 months and allow for a drug-free holiday. Patient understands she may need future therapy if weight gain resumes or she does not reach her weight loss goals on her own with good diet and exercise habits.  A total of 15 minutes were spent face-to-face with the patient during this encounter and over half of that time dealt with counseling and coordination of care.  Barnett Applebaum, MD, [facility], [facility] 08/02/2018  8:51 AM

## 2018-10-01 ENCOUNTER — Other Ambulatory Visit: Payer: Self-pay | Admitting: Obstetrics & Gynecology

## 2019-01-23 ENCOUNTER — Telehealth: Payer: Self-pay

## 2019-01-23 NOTE — Telephone Encounter (Signed)
Pt called about speaking with someone about her anxiety, pt states she was seen at a therapist office but she had a terrible experience and would like for [facility] to manage her anxiety. Please advise, thank you

## 2019-01-23 NOTE — Telephone Encounter (Signed)
Not something for me to manage, but we can refer to a PCP who does that if she desires

## 2019-01-24 NOTE — Telephone Encounter (Signed)
Provide list Primary Care in the area  This is not meant to be comprehensive list, but a resource for some providers that you can call for counseling or medication needs. If you have a recommendation, please let us know.   [facility]:  [phone]               Dr Miguel Aschoff               Dr Juanetta Beets               Dr Lavon Paganini  [facility]:  [phone]  Dr Drue Stager [facility], [place]:   [place], MD  Waunita Schooner, MD  Ria Bush, MD  Jinny Sanders, MD  [facility], [place]: [phone]  Orland Mustard, MD

## 2019-01-24 NOTE — Telephone Encounter (Signed)
Left message to advise pt of PCP,

## 2019-01-24 NOTE — Telephone Encounter (Signed)
Patient is returning call. Patient is requesting referral for PCP. Please advise

## 2019-01-24 NOTE — Telephone Encounter (Signed)
Pt aware.

## 2019-06-06 ENCOUNTER — Encounter: Payer: Self-pay | Admitting: Obstetrics & Gynecology

## 2019-06-06 ENCOUNTER — Ambulatory Visit (INDEPENDENT_AMBULATORY_CARE_PROVIDER_SITE_OTHER): Payer: 59 | Admitting: Obstetrics & Gynecology

## 2019-06-06 ENCOUNTER — Other Ambulatory Visit: Payer: Self-pay

## 2019-06-06 VITALS — BP 100/60 | Ht 61.0 in | Wt 133.0 lb

## 2019-06-06 DIAGNOSIS — E8881 Metabolic syndrome: Secondary | ICD-10-CM | POA: Diagnosis not present

## 2019-06-06 NOTE — Progress Notes (Signed)
  History of Present Illness:  Laura Mcintosh is a 29 y.o. who has been trying to lose weight, for her health and also for her upcoming wedding.  She has been on Phentermine in past.  Currently she is exercising regularly and follows a strict diet.  She cannot seem to lose any more weight these past 2 mos.    She also has stopped her OCPs and dose not desire contraception at this time.  IHIH.  PMHx: She  has a past medical history of ADD (attention deficit disorder), GAD (generalized anxiety disorder), Herpes simplex vulvovaginitis, Oral herpes, and Tension headache, chronic. Also,  has a past surgical history that includes Tonsillectomy., family history includes Diabetes in her father; Heart disease in her maternal grandfather; Other in her maternal grandmother; Stomach cancer in her maternal grandfather.,  reports that she has been smoking. She has been smoking about 0.25 packs per day. She has quit using smokeless tobacco. She reports current drug use. Drugs: Benzodiazepines, Hydrocodone, and Oxycodone. She reports that she does not drink alcohol.  She has a current medication list which includes the following prescription(s): norgestimate-ethinyl estradiol triphasic. Also, has No Known Allergies.  Review of Systems  All other systems reviewed and are negative.   Physical Exam:  BP 100/60   Ht 5\' 1"  (1.549 m)   Wt 133 lb (60.3 kg)   LMP 05/29/2019   BMI 25.13 kg/m  Body mass index is 25.13 kg/m. Filed Weights   06/06/19 1038  Weight: 133 lb (60.3 kg)    Physical Exam Constitutional:      General: She is not in acute distress.    Appearance: She is well-developed.  Musculoskeletal:        General: Normal range of motion.  Neurological:     Mental Status: She is alert and oriented to person, place, and time.  Skin:    General: Skin is warm and dry.  Vitals reviewed.     Assessment:    ICD-10-CM   1. Dysmetabolic syndrome  E88.81   2.      Contraception  mamanegemt  Plan: Will continue to assist patient in incorporating positive experiences into her life to promote a positive mental attitude.  Education given regarding appropriate lifestyle changes for weight loss, including regular physical activity, healthy coping strategies, caloric restriction, and healthy eating patterns.   Discussed Vitamin support, especially B12 and Omega 3 that has weight, metabolic, and health benefits.  Discussed and recommended Alli (over the counter mdeicine, former Rx Xenical) for weight loss effects.  Counseled that s her BMI is now 25, that it is harder to lose additional weight; and that Rx Phentermine is not indicated.  Contraception discussed. Considering Nexplanon again, had reg cycles and no side effects in past.  Does not want OCP again, often forgets dose.  A total of 20 minutes were spent face-to-face with the patient as well as preparation, review, communication, and documentation during this encounter.   08/06/19, MD, [facility], [facility] 06/06/2019  10:59 AM

## 2019-07-28 ENCOUNTER — Ambulatory Visit: Payer: 59 | Admitting: Obstetrics & Gynecology

## 2020-02-16 ENCOUNTER — Encounter: Payer: Self-pay | Admitting: Emergency Medicine

## 2020-02-16 ENCOUNTER — Emergency Department
Admission: EM | Admit: 2020-02-16 | Discharge: 2020-02-16 | Disposition: A | Discharge status: Home / Self Care | Payer: 59 | Attending: Emergency Medicine | Admitting: Emergency Medicine

## 2020-02-16 ENCOUNTER — Other Ambulatory Visit: Payer: Self-pay

## 2020-02-16 DIAGNOSIS — R1010 Upper abdominal pain, unspecified: Secondary | ICD-10-CM | POA: Insufficient documentation

## 2020-02-16 DIAGNOSIS — R1110 Vomiting, unspecified: Secondary | ICD-10-CM | POA: Diagnosis present

## 2020-02-16 DIAGNOSIS — U071 COVID-19: Secondary | ICD-10-CM | POA: Insufficient documentation

## 2020-02-16 DIAGNOSIS — Z5321 Procedure and treatment not carried out due to patient leaving prior to being seen by health care provider: Secondary | ICD-10-CM | POA: Insufficient documentation

## 2020-02-16 LAB — URINALYSIS, COMPLETE (UACMP) WITH MICROSCOPIC
Bilirubin Urine: NEGATIVE
Glucose, UA: NEGATIVE mg/dL
Hgb urine dipstick: NEGATIVE
Ketones, ur: 20 mg/dL — AB
Leukocytes,Ua: NEGATIVE
Nitrite: NEGATIVE
Protein, ur: NEGATIVE mg/dL
Specific Gravity, Urine: 1.021 (ref 1.005–1.030)
pH: 5 (ref 5.0–8.0)

## 2020-02-16 LAB — COMPREHENSIVE METABOLIC PANEL
ALT: 32 U/L (ref 0–44)
AST: 39 U/L (ref 15–41)
Albumin: 4.5 g/dL (ref 3.5–5.0)
Alkaline Phosphatase: 41 U/L (ref 38–126)
Anion gap: 14 (ref 5–15)
BUN: 12 mg/dL (ref 6–20)
CO2: 27 mmol/L (ref 22–32)
Calcium: 9.5 mg/dL (ref 8.9–10.3)
Chloride: 97 mmol/L — ABNORMAL LOW (ref 98–111)
Creatinine, Ser: 0.78 mg/dL (ref 0.44–1.00)
GFR, Estimated: >60 mL/min (ref >60)
Glucose, Bld: 107 mg/dL — ABNORMAL HIGH (ref 70–99)
Potassium: 4.2 mmol/L (ref 3.5–5.1)
Sodium: 138 mmol/L (ref 135–145)
Total Bilirubin: 0.7 mg/dL (ref 0.3–1.2)
Total Protein: 7.7 g/dL (ref 6.5–8.1)

## 2020-02-16 LAB — LIPASE, BLOOD: Lipase: 23 U/L (ref 11–51)

## 2020-02-16 LAB — CBC
HCT: 37.2 % (ref 36.0–46.0)
Hemoglobin: 12.9 g/dL (ref 12.0–15.0)
MCH: 30.5 pg (ref 26.0–34.0)
MCHC: 34.7 g/dL (ref 30.0–36.0)
MCV: 87.9 fL (ref 80.0–100.0)
Platelets: 160 10*3/uL (ref 150–400)
RBC: 4.23 MIL/uL (ref 3.87–5.11)
RDW: 11.6 % (ref 11.5–15.5)
WBC: 3.6 10*3/uL — ABNORMAL LOW (ref 4.0–10.5)
nRBC: 0 % (ref 0.0–0.2)

## 2020-02-16 LAB — POC URINE PREG, ED: Preg Test, Ur: NEGATIVE

## 2020-02-16 MED ORDER — ONDANSETRON 4 MG PO TBDP
4.0000 mg | ORAL_TABLET | Freq: Once | ORAL | Status: AC | PRN
  Administered 2020-02-16: 4 mg via ORAL
  Filled 2020-02-16: qty 1

## 2020-02-16 NOTE — ED Notes (Signed)
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

## 2020-02-16 NOTE — ED Triage Notes (Addendum)
Pt reports she has been vomiting since Wednesday, reports has not been able to hold any fluid or food. Reports chills at home unable to check temperature, pt reports upper abdominal pain denies any diarrhea. Pt talks in complete sentences no distress noted

## 2020-10-30 ENCOUNTER — Telehealth: Payer: Self-pay

## 2020-10-30 NOTE — Telephone Encounter (Signed)
Pt calling for refill of valocyclovir; currently has an outbreak and is in [place], [place];  [facility] [address].  [phone]

## 2020-10-31 ENCOUNTER — Other Ambulatory Visit: Payer: Self-pay | Admitting: Obstetrics & Gynecology

## 2020-10-31 MED ORDER — VALACYCLOVIR HCL 500 MG PO TABS: 500.0000 mg | ORAL_TABLET | Freq: Every day | ORAL | 1 refills | Status: DC

## 2020-10-31 NOTE — Telephone Encounter (Signed)
Rx sent 

## 2020-10-31 NOTE — Telephone Encounter (Signed)
Patient is scheduled for 12/06/20 with [facility]. Patient would like a call when prescription is sent in please

## 2020-10-31 NOTE — Telephone Encounter (Signed)
Left detailed msg [facility] in [place].

## 2020-10-31 NOTE — Telephone Encounter (Signed)
Called and left voicemail for patient to call back to be scheduled. 

## 2020-10-31 NOTE — Telephone Encounter (Signed)
Pt calling; called yesterday and hasn't heard anything; needs [facility] in [place], [place].  [phone]

## 2020-11-22 ENCOUNTER — Other Ambulatory Visit: Payer: Self-pay | Admitting: Obstetrics & Gynecology

## 2020-12-06 ENCOUNTER — Ambulatory Visit: Payer: 59 | Admitting: Obstetrics & Gynecology

## 2020-12-11 ENCOUNTER — Other Ambulatory Visit: Payer: Self-pay | Admitting: Obstetrics & Gynecology

## 2020-12-12 NOTE — Telephone Encounter (Signed)
Sch Annual

## 2020-12-12 NOTE — Telephone Encounter (Signed)
Called and left voicemail for patient to call back to be scheduled. 

## 2021-07-23 ENCOUNTER — Ambulatory Visit (INDEPENDENT_AMBULATORY_CARE_PROVIDER_SITE_OTHER): Payer: Self-pay | Admitting: Dermatology

## 2021-07-23 DIAGNOSIS — L988 Other specified disorders of the skin and subcutaneous tissue: Secondary | ICD-10-CM

## 2021-07-23 NOTE — Progress Notes (Signed)
   New Patient Visit  Subjective  Laura Mcintosh is a 31 y.o. female who presents for the following: Facial Elastosis (Last Botox injection was on 04/26/21 at [facility] in [place], [place]).   The following portions of the chart were reviewed this encounter and updated as appropriate:   Tobacco  Allergies  Meds  Problems  Med Hx  Surg Hx  Fam Hx      Review of Systems:  No other skin or systemic complaints except as noted in HPI or Assessment and Plan.  Objective  Well appearing patient in no apparent distress; mood and affect are within normal limits.  A focused examination was performed including the face. Relevant physical exam findings are noted in the Assessment and Plan.  Face Rhytides and volume loss.                      Assessment & Plan  Elastosis of skin Face  Discussed 1 syringe of Restylane Refyne into the lips and superficial smile lines  Botox 35.25 units injected as marked:  - Frown complex 19 units - Forehead 6.25 units - Lip flip 10 units  Botox Injection - Face Location: See attached image  Informed consent: Discussed risks (infection, pain, bleeding, bruising, swelling, allergic reaction, paralysis of nearby muscles, eyelid droop, double vision, neck weakness, difficulty breathing, headache, undesirable cosmetic result, and need for additional treatment) and benefits of the procedure, as well as the alternatives.  Informed consent was obtained.  Preparation: The area was cleansed with alcohol.  Procedure Details:  Botox was injected into the dermis with a 30-gauge needle. Pressure applied to any bleeding. Ice packs offered for swelling.  Lot Number:  [id number] Expiration:  05/2023  Total Units Injected:  35.25  Plan: Patient was instructed to remain upright for 4 hours. Patient was instructed to avoid massaging the face and avoid vigorous exercise for the rest of the day. Tylenol may be used for headache.  Allow 2 weeks  before returning to clinic for additional dosing as needed. Patient will call for any problems.    Return for Botox follow up in 2-4 weeks.  Maylene Roes, CMA, am acting as scribe for Darden Dates, MD .  Documentation: I have reviewed the above documentation for accuracy and completeness, and I agree with the above.  Darden Dates, MD

## 2021-07-23 NOTE — Patient Instructions (Signed)
Due to recent changes in healthcare laws, you may see results of your pathology and/or laboratory studies on MyChart before the doctors have had a chance to review them. We understand that in some cases there may be results that are confusing or concerning to you. Please understand that not all results are received at the same time and often the doctors may need to interpret multiple results in order to provide you with the best plan of care or course of treatment. Therefore, we ask that you please give us 2 business days to thoroughly review all your results before contacting the office for clarification. Should we see a critical lab result, you will be contacted sooner.   If You Need Anything After Your Visit  If you have any questions or concerns for your doctor, please call our main line at [phone] and press option 4 to reach your doctor's medical assistant. If no one answers, please leave a voicemail as directed and we will return your call as soon as possible. Messages left after 4 pm will be answered the following business day.   You may also send us a message via MyChart. We typically respond to MyChart messages within 1-2 business days.  For prescription refills, please ask your pharmacy to contact our office. Our fax number is [phone].  If you have an urgent issue when the clinic is closed that cannot wait until the next business day, you can page your doctor at the number below.    Please note that while we do our best to be available for urgent issues outside of office hours, we are not available 24/7.   If you have an urgent issue and are unable to reach us, you may choose to seek medical care at your doctor's office, retail clinic, urgent care center, or emergency room.  If you have a medical emergency, please immediately call 911 or go to the emergency department.  Pager Numbers  - Dr. Kowalski: [phone]  - Dr. Moye: [phone]  - Dr. Stewart:  [phone]  In the event of inclement weather, please call our main line at [phone] for an update on the status of any delays or closures.  Dermatology Medication Tips: Please keep the boxes that topical medications come in in order to help keep track of the instructions about where and how to use these. Pharmacies typically print the medication instructions only on the boxes and not directly on the medication tubes.   If your medication is too expensive, please contact our office at [phone] option 4 or send us a message through MyChart.   We are unable to tell what your co-pay for medications will be in advance as this is different depending on your insurance coverage. However, we may be able to find a substitute medication at lower cost or fill out paperwork to get insurance to cover a needed medication.   If a prior authorization is required to get your medication covered by your insurance company, please allow us 1-2 business days to complete this process.  Drug prices often vary depending on where the prescription is filled and some pharmacies may offer cheaper prices.  The website www.goodrx.com contains coupons for medications through different pharmacies. The prices here do not account for what the cost may be with help from insurance (it may be cheaper with your insurance), but the website can give you the price if you did not use any insurance.  - You can print the associated coupon and take it with   your prescription to the pharmacy.  - You may also stop by our office during regular business hours and pick up a GoodRx coupon card.  - If you need your prescription sent electronically to a different pharmacy, notify our office through [facility] MyChart or by phone at [phone] option 4.     Si Usted Necesita Algo Despus de Su Visita  Tambin puede enviarnos un mensaje a travs de MyChart. Por lo general respondemos a los mensajes de MyChart en el transcurso de 1 a 2  das hbiles.  Para renovar recetas, por favor pida a su farmacia que se ponga en contacto con nuestra oficina. Nuestro nmero de fax es el [phone].  Si tiene un asunto urgente cuando la clnica est cerrada y que no puede esperar hasta el siguiente da hbil, puede llamar/localizar a su doctor(a) al nmero que aparece a continuacin.   Por favor, tenga en cuenta que aunque hacemos todo lo posible para estar disponibles para asuntos urgentes fuera del horario de oficina, no estamos disponibles las 24 horas del da, los 7 das de la semana.   Si tiene un problema urgente y no puede comunicarse con nosotros, puede optar por buscar atencin mdica  en el consultorio de su doctor(a), en una clnica privada, en un centro de atencin urgente o en una sala de emergencias.  Si tiene una emergencia mdica, por favor llame inmediatamente al 911 o vaya a la sala de emergencias.  Nmeros de bper  - Dr. Kowalski: [phone]  - Dra. Moye: [phone]  - Dra. Stewart: [phone]  En caso de inclemencias del tiempo, por favor llame a nuestra lnea principal al [phone] para una actualizacin sobre el estado de cualquier retraso o cierre.  Consejos para la medicacin en dermatologa: Por favor, guarde las cajas en las que vienen los medicamentos de uso tpico para ayudarle a seguir las instrucciones sobre dnde y cmo usarlos. Las farmacias generalmente imprimen las instrucciones del medicamento slo en las cajas y no directamente en los tubos del medicamento.   Si su medicamento es muy caro, por favor, pngase en contacto con nuestra oficina llamando al [phone] y presione la opcin 4 o envenos un mensaje a travs de MyChart.   No podemos decirle cul ser su copago por los medicamentos por adelantado ya que esto es diferente dependiendo de la cobertura de su seguro. Sin embargo, es posible que podamos encontrar un medicamento sustituto a menor costo o llenar un formulario para que el  seguro cubra el medicamento que se considera necesario.   Si se requiere una autorizacin previa para que su compaa de seguros cubra su medicamento, por favor permtanos de 1 a 2 das hbiles para completar este proceso.  Los precios de los medicamentos varan con frecuencia dependiendo del lugar de dnde se surte la receta y alguna farmacias pueden ofrecer precios ms baratos.  El sitio web www.goodrx.com tiene cupones para medicamentos de diferentes farmacias. Los precios aqu no tienen en cuenta lo que podra costar con la ayuda del seguro (puede ser ms barato con su seguro), pero el sitio web puede darle el precio si no utiliz ningn seguro.  - Puede imprimir el cupn correspondiente y llevarlo con su receta a la farmacia.  - Tambin puede pasar por nuestra oficina durante el horario de atencin regular y recoger una tarjeta de cupones de GoodRx.  - Si necesita que su receta se enve electrnicamente a una farmacia diferente, informe a nuestra oficina a travs de MyChart de [place]   o por telfono llamando al [phone] y presione la opcin 4.  

## 2021-07-29 ENCOUNTER — Encounter: Payer: Self-pay | Admitting: Dermatology

## 2021-08-21 ENCOUNTER — Ambulatory Visit: Payer: BC Managed Care – PPO | Admitting: Dermatology

## 2021-11-12 ENCOUNTER — Ambulatory Visit (INDEPENDENT_AMBULATORY_CARE_PROVIDER_SITE_OTHER): Payer: Self-pay | Admitting: Dermatology

## 2021-11-12 DIAGNOSIS — L988 Other specified disorders of the skin and subcutaneous tissue: Secondary | ICD-10-CM

## 2021-11-12 NOTE — Patient Instructions (Signed)
Due to recent changes in healthcare laws, you may see results of your pathology and/or laboratory studies on MyChart before the doctors have had a chance to review them. We understand that in some cases there may be results that are confusing or concerning to you. Please understand that not all results are received at the same time and often the doctors may need to interpret multiple results in order to provide you with the best plan of care or course of treatment. Therefore, we ask that you please give us 2 business days to thoroughly review all your results before contacting the office for clarification. Should we see a critical lab result, you will be contacted sooner.   If You Need Anything After Your Visit  If you have any questions or concerns for your doctor, please call our main line at [phone] and press option 4 to reach your doctor's medical assistant. If no one answers, please leave a voicemail as directed and we will return your call as soon as possible. Messages left after 4 pm will be answered the following business day.   You may also send us a message via MyChart. We typically respond to MyChart messages within 1-2 business days.  For prescription refills, please ask your pharmacy to contact our office. Our fax number is [phone].  If you have an urgent issue when the clinic is closed that cannot wait until the next business day, you can page your doctor at the number below.    Please note that while we do our best to be available for urgent issues outside of office hours, we are not available 24/7.   If you have an urgent issue and are unable to reach us, you may choose to seek medical care at your doctor's office, retail clinic, urgent care center, or emergency room.  If you have a medical emergency, please immediately call 911 or go to the emergency department.  Pager Numbers  - Dr. Kowalski: [phone]  - Dr. Moye: [phone]  - Dr. Stewart:  [phone]  In the event of inclement weather, please call our main line at [phone] for an update on the status of any delays or closures.  Dermatology Medication Tips: Please keep the boxes that topical medications come in in order to help keep track of the instructions about where and how to use these. Pharmacies typically print the medication instructions only on the boxes and not directly on the medication tubes.   If your medication is too expensive, please contact our office at [phone] option 4 or send us a message through MyChart.   We are unable to tell what your co-pay for medications will be in advance as this is different depending on your insurance coverage. However, we may be able to find a substitute medication at lower cost or fill out paperwork to get insurance to cover a needed medication.   If a prior authorization is required to get your medication covered by your insurance company, please allow us 1-2 business days to complete this process.  Drug prices often vary depending on where the prescription is filled and some pharmacies may offer cheaper prices.  The website www.goodrx.com contains coupons for medications through different pharmacies. The prices here do not account for what the cost may be with help from insurance (it may be cheaper with your insurance), but the website can give you the price if you did not use any insurance.  - You can print the associated coupon and take it with   your prescription to the pharmacy.  - You may also stop by our office during regular business hours and pick up a GoodRx coupon card.  - If you need your prescription sent electronically to a different pharmacy, notify our office through [facility] MyChart or by phone at [phone] option 4.     Si Usted Necesita Algo Despus de Su Visita  Tambin puede enviarnos un mensaje a travs de MyChart. Por lo general respondemos a los mensajes de MyChart en el transcurso de 1 a 2  das hbiles.  Para renovar recetas, por favor pida a su farmacia que se ponga en contacto con nuestra oficina. Nuestro nmero de fax es el [phone].  Si tiene un asunto urgente cuando la clnica est cerrada y que no puede esperar hasta el siguiente da hbil, puede llamar/localizar a su doctor(a) al nmero que aparece a continuacin.   Por favor, tenga en cuenta que aunque hacemos todo lo posible para estar disponibles para asuntos urgentes fuera del horario de oficina, no estamos disponibles las 24 horas del da, los 7 das de la semana.   Si tiene un problema urgente y no puede comunicarse con nosotros, puede optar por buscar atencin mdica  en el consultorio de su doctor(a), en una clnica privada, en un centro de atencin urgente o en una sala de emergencias.  Si tiene una emergencia mdica, por favor llame inmediatamente al 911 o vaya a la sala de emergencias.  Nmeros de bper  - Dr. Kowalski: [phone]  - Dra. Moye: [phone]  - Dra. Stewart: [phone]  En caso de inclemencias del tiempo, por favor llame a nuestra lnea principal al [phone] para una actualizacin sobre el estado de cualquier retraso o cierre.  Consejos para la medicacin en dermatologa: Por favor, guarde las cajas en las que vienen los medicamentos de uso tpico para ayudarle a seguir las instrucciones sobre dnde y cmo usarlos. Las farmacias generalmente imprimen las instrucciones del medicamento slo en las cajas y no directamente en los tubos del medicamento.   Si su medicamento es muy caro, por favor, pngase en contacto con nuestra oficina llamando al [phone] y presione la opcin 4 o envenos un mensaje a travs de MyChart.   No podemos decirle cul ser su copago por los medicamentos por adelantado ya que esto es diferente dependiendo de la cobertura de su seguro. Sin embargo, es posible que podamos encontrar un medicamento sustituto a menor costo o llenar un formulario para que el  seguro cubra el medicamento que se considera necesario.   Si se requiere una autorizacin previa para que su compaa de seguros cubra su medicamento, por favor permtanos de 1 a 2 das hbiles para completar este proceso.  Los precios de los medicamentos varan con frecuencia dependiendo del lugar de dnde se surte la receta y alguna farmacias pueden ofrecer precios ms baratos.  El sitio web www.goodrx.com tiene cupones para medicamentos de diferentes farmacias. Los precios aqu no tienen en cuenta lo que podra costar con la ayuda del seguro (puede ser ms barato con su seguro), pero el sitio web puede darle el precio si no utiliz ningn seguro.  - Puede imprimir el cupn correspondiente y llevarlo con su receta a la farmacia.  - Tambin puede pasar por nuestra oficina durante el horario de atencin regular y recoger una tarjeta de cupones de GoodRx.  - Si necesita que su receta se enve electrnicamente a una farmacia diferente, informe a nuestra oficina a travs de MyChart de [facility]   o por telfono llamando al [phone] y presione la opcin 4.  

## 2021-11-12 NOTE — Progress Notes (Signed)
   Follow-Up Visit   Subjective  Laura Mcintosh is a 31 y.o. female who presents for the following: Facial Elastosis (Patient here today for Botox injections. Would like to discuss filler at lips.).   The following portions of the chart were reviewed this encounter and updated as appropriate:   Tobacco  Allergies  Meds  Problems  Med Hx  Surg Hx  Fam Hx      Review of Systems:  No other skin or systemic complaints except as noted in HPI or Assessment and Plan.  Objective  Well appearing patient in no apparent distress; mood and affect are within normal limits.  A focused examination was performed including face. Relevant physical exam findings are noted in the Assessment and Plan.  face Rhytides and volume loss.        Assessment & Plan  Elastosis of skin face  Botox 35.25 units injected as marked:  - Frown complex 19 units - Forehead 6.25 units - Lip flip 10 units  Filling material injection - face Location: See attached image  Informed consent: Discussed risks (infection, pain, bleeding, bruising, swelling, allergic reaction, paralysis of nearby muscles, eyelid droop, double vision, neck weakness, difficulty breathing, headache, undesirable cosmetic result, and need for additional treatment) and benefits of the procedure, as well as the alternatives.  Informed consent was obtained.  Preparation: The area was cleansed with alcohol.  Procedure Details:  Botox was injected into the dermis with a 30-gauge needle. Pressure applied to any bleeding. Ice packs offered for swelling.  Lot Number:  [id number] Expiration:  12/2023  Total Units Injected:  35.25  Plan: Tylenol may be used for headache.  Allow 2 weeks before returning to clinic for additional dosing as needed. Patient will call for any problems.    Return in about 3 months (around 02/12/2022) for Botox.  Graciella Belton, RMA, am acting as scribe for Forest Gleason, MD .  Documentation: I have  reviewed the above documentation for accuracy and completeness, and I agree with the above.  Forest Gleason, MD

## 2021-11-20 ENCOUNTER — Encounter: Payer: Self-pay | Admitting: Dermatology

## 2021-12-10 ENCOUNTER — Ambulatory Visit (INDEPENDENT_AMBULATORY_CARE_PROVIDER_SITE_OTHER): Payer: Self-pay | Admitting: Dermatology

## 2021-12-10 ENCOUNTER — Ambulatory Visit: Payer: BC Managed Care – PPO

## 2021-12-10 DIAGNOSIS — L988 Other specified disorders of the skin and subcutaneous tissue: Secondary | ICD-10-CM

## 2021-12-10 NOTE — Patient Instructions (Signed)
Due to recent changes in healthcare laws, you may see results of your pathology and/or laboratory studies on MyChart before the doctors have had a chance to review them. We understand that in some cases there may be results that are confusing or concerning to you. Please understand that not all results are received at the same time and often the doctors may need to interpret multiple results in order to provide you with the best plan of care or course of treatment. Therefore, we ask that you please give us 2 business days to thoroughly review all your results before contacting the office for clarification. Should we see a critical lab result, you will be contacted sooner.   If You Need Anything After Your Visit  If you have any questions or concerns for your doctor, please call our main line at [phone] and press option 4 to reach your doctor's medical assistant. If no one answers, please leave a voicemail as directed and we will return your call as soon as possible. Messages left after 4 pm will be answered the following business day.   You may also send us a message via MyChart. We typically respond to MyChart messages within 1-2 business days.  For prescription refills, please ask your pharmacy to contact our office. Our fax number is [phone].  If you have an urgent issue when the clinic is closed that cannot wait until the next business day, you can page your doctor at the number below.    Please note that while we do our best to be available for urgent issues outside of office hours, we are not available 24/7.   If you have an urgent issue and are unable to reach us, you may choose to seek medical care at your doctor's office, retail clinic, urgent care center, or emergency room.  If you have a medical emergency, please immediately call 911 or go to the emergency department.  Pager Numbers  - Dr. Kowalski: [phone]  - Dr. Moye: [phone]  - Dr. Stewart:  [phone]  In the event of inclement weather, please call our main line at [phone] for an update on the status of any delays or closures.  Dermatology Medication Tips: Please keep the boxes that topical medications come in in order to help keep track of the instructions about where and how to use these. Pharmacies typically print the medication instructions only on the boxes and not directly on the medication tubes.   If your medication is too expensive, please contact our office at [phone] option 4 or send us a message through MyChart.   We are unable to tell what your co-pay for medications will be in advance as this is different depending on your insurance coverage. However, we may be able to find a substitute medication at lower cost or fill out paperwork to get insurance to cover a needed medication.   If a prior authorization is required to get your medication covered by your insurance company, please allow us 1-2 business days to complete this process.  Drug prices often vary depending on where the prescription is filled and some pharmacies may offer cheaper prices.  The website www.goodrx.com contains coupons for medications through different pharmacies. The prices here do not account for what the cost may be with help from insurance (it may be cheaper with your insurance), but the website can give you the price if you did not use any insurance.  - You can print the associated coupon and take it with   your prescription to the pharmacy.  - You may also stop by our office during regular business hours and pick up a GoodRx coupon card.  - If you need your prescription sent electronically to a different pharmacy, notify our office through [facility] MyChart or by phone at [phone] option 4.     Si Usted Necesita Algo Despus de Su Visita  Tambin puede enviarnos un mensaje a travs de MyChart. Por lo general respondemos a los mensajes de MyChart en el transcurso de 1 a 2  das hbiles.  Para renovar recetas, por favor pida a su farmacia que se ponga en contacto con nuestra oficina. Nuestro nmero de fax es el [phone].  Si tiene un asunto urgente cuando la clnica est cerrada y que no puede esperar hasta el siguiente da hbil, puede llamar/localizar a su doctor(a) al nmero que aparece a continuacin.   Por favor, tenga en cuenta que aunque hacemos todo lo posible para estar disponibles para asuntos urgentes fuera del horario de oficina, no estamos disponibles las 24 horas del da, los 7 das de la semana.   Si tiene un problema urgente y no puede comunicarse con nosotros, puede optar por buscar atencin mdica  en el consultorio de su doctor(a), en una clnica privada, en un centro de atencin urgente o en una sala de emergencias.  Si tiene una emergencia mdica, por favor llame inmediatamente al 911 o vaya a la sala de emergencias.  Nmeros de bper  - Dr. Kowalski: [phone]  - Dra. Moye: [phone]  - Dra. Stewart: [phone]  En caso de inclemencias del tiempo, por favor llame a nuestra lnea principal al [phone] para una actualizacin sobre el estado de cualquier retraso o cierre.  Consejos para la medicacin en dermatologa: Por favor, guarde las cajas en las que vienen los medicamentos de uso tpico para ayudarle a seguir las instrucciones sobre dnde y cmo usarlos. Las farmacias generalmente imprimen las instrucciones del medicamento slo en las cajas y no directamente en los tubos del medicamento.   Si su medicamento es muy caro, por favor, pngase en contacto con nuestra oficina llamando al [phone] y presione la opcin 4 o envenos un mensaje a travs de MyChart.   No podemos decirle cul ser su copago por los medicamentos por adelantado ya que esto es diferente dependiendo de la cobertura de su seguro. Sin embargo, es posible que podamos encontrar un medicamento sustituto a menor costo o llenar un formulario para que el  seguro cubra el medicamento que se considera necesario.   Si se requiere una autorizacin previa para que su compaa de seguros cubra su medicamento, por favor permtanos de 1 a 2 das hbiles para completar este proceso.  Los precios de los medicamentos varan con frecuencia dependiendo del lugar de dnde se surte la receta y alguna farmacias pueden ofrecer precios ms baratos.  El sitio web www.goodrx.com tiene cupones para medicamentos de diferentes farmacias. Los precios aqu no tienen en cuenta lo que podra costar con la ayuda del seguro (puede ser ms barato con su seguro), pero el sitio web puede darle el precio si no utiliz ningn seguro.  - Puede imprimir el cupn correspondiente y llevarlo con su receta a la farmacia.  - Tambin puede pasar por nuestra oficina durante el horario de atencin regular y recoger una tarjeta de cupones de GoodRx.  - Si necesita que su receta se enve electrnicamente a una farmacia diferente, informe a nuestra oficina a travs de MyChart de [facility]   o por telfono llamando al [phone] y presione la opcin 4.  

## 2021-12-10 NOTE — Progress Notes (Unsigned)
   Follow-Up Visit   Subjective  Laura Mcintosh is a 31 y.o. female who presents for the following: Facial Elastosis (Patient here today for Botox follow up. She is interested in adding to forehead. ).   The following portions of the chart were reviewed this encounter and updated as appropriate:   Tobacco  Allergies  Meds  Problems  Med Hx  Surg Hx  Fam Hx      Review of Systems:  No other skin or systemic complaints except as noted in HPI or Assessment and Plan.  Objective  Well appearing patient in no apparent distress; mood and affect are within normal limits.  A focused examination was performed including face. Relevant physical exam findings are noted in the Assessment and Plan.  face Rhytides and volume loss.        Assessment & Plan  Elastosis of skin face  Filling material injection - face Location: See attached image  Informed consent: Discussed risks (infection, pain, bleeding, bruising, swelling, allergic reaction, paralysis of nearby muscles, eyelid droop, double vision, neck weakness, difficulty breathing, headache, undesirable cosmetic result, and need for additional treatment) and benefits of the procedure, as well as the alternatives.  Informed consent was obtained.  Preparation: The area was cleansed with alcohol.  Procedure Details:  Botox was injected into the dermis with a 30-gauge needle. Pressure applied to any bleeding. Ice packs offered for swelling.  Lot Number:  [id number] Expiration:  02/2024  Total Units Injected:  8  Plan: Tylenol may be used for headache.  Allow 2 weeks before returning to clinic for additional dosing as needed. Patient will call for any problems.    Return for as scheduled.  Anise Salvo, RMA, am acting as scribe for Darden Dates, MD .  Documentation: I have reviewed the above documentation for accuracy and completeness, and I agree with the above.  Darden Dates, MD

## 2021-12-11 ENCOUNTER — Encounter: Payer: Self-pay | Admitting: Dermatology

## 2022-02-18 ENCOUNTER — Ambulatory Visit: Payer: BC Managed Care – PPO | Admitting: Dermatology

## 2022-02-18 ENCOUNTER — Ambulatory Visit: Payer: BC Managed Care – PPO

## 2022-03-25 ENCOUNTER — Ambulatory Visit: Payer: BC Managed Care – PPO | Admitting: Dermatology

## 2023-03-12 ENCOUNTER — Telehealth: Payer: BC Managed Care – PPO | Admitting: Family Medicine

## 2023-03-12 DIAGNOSIS — J101 Influenza due to other identified influenza virus with other respiratory manifestations: Secondary | ICD-10-CM

## 2023-03-12 DIAGNOSIS — J111 Influenza due to unidentified influenza virus with other respiratory manifestations: Secondary | ICD-10-CM | POA: Diagnosis not present

## 2023-03-12 MED ORDER — OSELTAMIVIR PHOSPHATE 75 MG PO CAPS: 75.0000 mg | ORAL_CAPSULE | Freq: Two times a day (BID) | ORAL | 0 refills | Status: AC

## 2023-03-12 NOTE — Progress Notes (Signed)

## 2023-03-27 ENCOUNTER — Telehealth: Admitting: Family Medicine

## 2023-03-27 DIAGNOSIS — B001 Herpesviral vesicular dermatitis: Secondary | ICD-10-CM

## 2023-03-27 MED ORDER — VALACYCLOVIR HCL 1 G PO TABS: 2000.0000 mg | ORAL_TABLET | Freq: Two times a day (BID) | ORAL | 0 refills | Status: AC

## 2023-03-27 NOTE — Progress Notes (Signed)
E-Visit for [employer]  We are sorry that you are not feeling well.  Here is how we plan to help!  Based on what you have shared with me it does look like you have a viral infection.    Most cold sores or fever blisters are small fluid filled blisters around the mouth caused by herpes simplex virus.  The most common strain of the virus causing cold sores is herpes simplex virus 1.  It can be spread by skin contact, sharing eating utensils, or even sharing towels.  Cold sores are contagious to other people until dry. (Approximately 5-7 days).  Wash your hands. You can spread the virus to your eyes through handling your contact lenses after touching the lesions.  Most people experience pain at the sight or tingling sensations in their lips that may begin before the ulcers erupt.  Herpes simplex is treatable but not curable.  It may lie dormant for a long time and then reappear due to stress or prolonged sun exposure.  Many patients have success in treating their cold sores with an over the counter topical called Abreva.  You may apply the cream up to 5 times daily (maximum 10 days) until healing occurs.  If you would like to use an oral antiviral medication to speed the healing of your cold sore, I have sent a prescription to your local pharmacy Valacyclovir 2 gm take one by mouth twice a day for 1 day    HOME CARE:  Wash your hands frequently. Do not pick at or rub the sore. Don't open the blisters. Avoid kissing other people during this time. Avoid sharing drinking glasses, eating utensils, or razors. Do not handle contact lenses unless you have thoroughly washed your hands with soap and warm water! Avoid oral sex during this time.  Herpes from sores on your mouth can spread to your partner's genital area. Avoid contact with anyone who has eczema or a weakened immune system. Cold sores are often triggered by exposure to intense sunlight, use a lip balm containing a sunscreen (SPF 30 or  higher).  GET HELP RIGHT AWAY IF:  Blisters look infected. Blisters occur near or in the eye. Symptoms last longer than 10 days. Your symptoms become worse.  MAKE SURE YOU:  Understand these instructions. Will watch your condition. Will get help right away if you are not doing well or get worse.    Your e-visit answers were reviewed by a board certified advanced clinical practitioner to complete your personal care plan.  Depending upon the condition, your plan could have  Included both over the counter or prescription medications.    Please review your pharmacy choice.  Be sure that the pharmacy you have chosen is open so that you can pick up your prescription now.  If there is a problem you can message your provider in MyChart to have the prescription routed to another pharmacy.    Your safety is important to [place].  If you have drug allergies check our prescription carefully.  For the next 24 hours you can use MyChart to ask questions about today's visit, request a non-urgent call back, or ask for a work or school excuse from your e-visit provider.  You will get an email in the next two days asking about your experience.  I hope that your e-visit has been valuable and will speed your recovery.    have provided 5 minutes of non face to face time during this encounter for chart review  and documentation.

## 2023-06-18 ENCOUNTER — Telehealth: Admitting: Physician Assistant

## 2023-06-18 DIAGNOSIS — B001 Herpesviral vesicular dermatitis: Secondary | ICD-10-CM

## 2023-06-18 MED ORDER — ACYCLOVIR 800 MG PO TABS: 800.0000 mg | ORAL_TABLET | Freq: Two times a day (BID) | ORAL | 0 refills | Status: AC

## 2023-06-18 NOTE — Progress Notes (Signed)
 I have spent 5 minutes in review of e-visit questionnaire, review and updating patient chart, medical decision making and response to patient.   Laure Kidney, PA-C

## 2023-06-18 NOTE — Progress Notes (Signed)
 E-Visit for [employer]  We are sorry that you are not feeling well.  Here is how we plan to help!  Based on what you have shared with me it does look like you have a viral infection.    Most cold sores or fever blisters are small fluid filled blisters around the mouth caused by herpes simplex virus.  The most common strain of the virus causing cold sores is herpes simplex virus 1.  It can be spread by skin contact, sharing eating utensils, or even sharing towels.  Cold sores are contagious to other people until dry. (Approximately 5-7 days).  Wash your hands. You can spread the virus to your eyes through handling your contact lenses after touching the lesions.  Most people experience pain at the sight or tingling sensations in their lips that may begin before the ulcers erupt.  Herpes simplex is treatable but not curable.  It may lie dormant for a long time and then reappear due to stress or prolonged sun exposure.  Many patients have success in treating their cold sores with an over the counter topical called Abreva.  You may apply the cream up to 5 times daily (maximum 10 days) until healing occurs.  If you would like to use an oral antiviral medication to speed the healing of your cold sore, I have sent a prescription to your local pharmacy Acyclovir 800 mg take one by mouth twice a day for 7 days    HOME CARE:  Wash your hands frequently. Do not pick at or rub the sore. Don't open the blisters. Avoid kissing other people during this time. Avoid sharing drinking glasses, eating utensils, or razors. Do not handle contact lenses unless you have thoroughly washed your hands with soap and warm water! Avoid oral sex during this time.  Herpes from sores on your mouth can spread to your partner's genital area. Avoid contact with anyone who has eczema or a weakened immune system. Cold sores are often triggered by exposure to intense sunlight, use a lip balm containing a sunscreen (SPF 30 or  higher).  GET HELP RIGHT AWAY IF:  Blisters look infected. Blisters occur near or in the eye. Symptoms last longer than 10 days. Your symptoms become worse.  MAKE SURE YOU:  Understand these instructions. Will watch your condition. Will get help right away if you are not doing well or get worse.    Your e-visit answers were reviewed by a board certified advanced clinical practitioner to complete your personal care plan.  Depending upon the condition, your plan could have  Included both over the counter or prescription medications.    Please review your pharmacy choice.  Be sure that the pharmacy you have chosen is open so that you can pick up your prescription now.  If there is a problem you can message your provider in MyChart to have the prescription routed to another pharmacy.    Your safety is important to [place].  If you have drug allergies check our prescription carefully.  For the next 24 hours you can use MyChart to ask questions about today's visit, request a non-urgent call back, or ask for a work or school excuse from your e-visit provider.  You will get an email in the next two days asking about your experience.  I hope that your e-visit has been valuable and will speed your recovery.
